# Patient Record
Sex: Female | Born: 1965 | Race: White | Hispanic: No | Marital: Married
Health system: Midwestern US, Community
[De-identification: ages and names within clinical notes are randomized; demographics above are authoritative.]

## PROBLEM LIST (undated history)

## (undated) DIAGNOSIS — K811 Chronic cholecystitis: Secondary | ICD-10-CM

---

## 2010-04-14 NOTE — Procedures (Signed)
[facility]   [address], [place], [place] [address]   SLEEP DISORDER CENTER   POLYSOMNOGRAM    PATIENT: Becky Nelson, Becky Nelson  MRN: [id number] DATE: 04/01/2010  BILLING: [id number] LOCATION:  INTERPRETING: Dorna Leitz, MD  REFERRING: Dorna Leitz, MD      STUDY NUMBER: 12-22.    PROCEDURE: PAP titration polysomnography (958.11).    DATA SUMMARY: The following is the diagnostic portion of the night: Total  sleep time 153.5 minutes, 63% sleep efficiency. The RDI was 54. The  percentage of sleep in REM was 8. Minimum oxygen saturation was 79%. The  arousal index was 68. Supine REM was 13 minutes. Awake after sleep onset  was 24 minutes. Periodic limb movements index was 24. The average heart  rate was 67 beats per minute. During the entire night, the patient had 22  minutes with a saturation below 88%.    TREATMENT PORTION: Sleep efficiency was 52% with 98 minutes slept. The RDI  in the treatment portion was 9. The REM percentage of sleep was 24. Minimum  oxygen saturation was 934. Snoring index was 45. Awake after sleep onset  was 79 minutes. Arousal index was 18 with PLM index of 24. Supine REM was  24 minutes with an average heart rate of 67 beats per minute.    MEDICATIONS  1. Amantadine.  2. Benicar.  3. Crestor.  4. Effexor.  5. Elavil.  6. Metformin.  7. Protonix.  8. Synthroid.    OBSERVATIONS: The blood pressure was 114/73 before the study and 120/78  afterwards. Tachypnea was noted frequently throughout the night. Mouth  breathing was occasionally noted, especially after CPAP titration.  Supplemental oxygen was not used. Sedation was not used. The patient had  insufficient sleep the previous night. Sleep hygiene violations included  caffeine. Sleep was interrupted by bathroom trip. No alpha intrusion or EEG  abnormalities occurred. There was an increased delta percentage. Delta was  43% of the total sleep time in the diagnostic portion and 29% in the   treatment portion. The cardiac tracing showed normal sinus rhythm; 18  awakenings occurred during the study.    The best mask was the [employer] 406 mask. The patient also tried  the small Quattro and the small Quattro FX. These were not used because of  air leaks. The pressures used during the study 5, 7, 9, 10, 12 and 14 CWP.  At a pressure of 12 CWP, the patient had supine REM. However, the RDI at  this pressure overall was 17.04, which is not adequate. She did well with  an index of zero at 14 CWP. However, this contained no supine REM.    IMPRESSION: Obstructive sleep apnea.    RECOMMENDATIONS: Will initiate CPAP using auto because of the significantly  higher need for pressure in supine REM but not so much in the other  portion, and the patient had difficulty staying asleep at the higher  pressure. Results and instructions regarding treatment will be reviewed  with the patient.                     Electronically Signed   Dorna Leitz, MD 04/15/2010 19:44   Dorna Leitz, MD    ADR:WMX  D: 04/14/2010 4:17 P T: 04/14/2010 11:49 P  Job #: [id number] CScriptDoc #: [id number]  cc: Dorna Leitz, MD  DR. _________ (UNIDENTIFIED/NOT DISTRIBUTED)

## 2010-04-14 NOTE — Procedures (Signed)
Procedures signed by  at 04/15/10  1944                 Author: Dorna Leitz, Becky Nelson  Service: --  Author Type: Physician            Filed:   Date of Service: 04/14/10 2349  Status: Signed            Procedure Orders        1. POLYSOMNOGRAPHY 1 NIGHT [[id number]] ordered by  at 04/14/10 2349                         <!--EPICS-->                      [facility]<BR>                 [address] [place], [place]  [address]<BR>                             SLEEP DISORDER CENTER<BR>                                POLYSOMNOGRAM<BR> <BR> PATIENT:      Becky Nelson, Becky Nelson<BR> MRN:              [id number]    DATE:       04/01/2010<BR> BILLING:          [id number]   LOCATION:<BR> INTERPRETING:  Becky Goldsmith Vasiliy Mccarry, Becky Nelson<BR> REFERRING:    Becky Goldsmith Mandolin Falwell, Becky Nelson<BR> <BR> <BR> STUDY NUMBER: 12-22.<BR> <BR> PROCEDURE: PAP titration polysomnography (958.11).<BR> <BR> DATA SUMMARY: The following is the diagnostic portion of the night: Total<BR> sleep time 153.5  minutes, 63% sleep efficiency. The RDI was 54. The<BR> percentage of sleep in REM was 8. Minimum oxygen saturation was 79%. The<BR> arousal index was 68. Supine REM was 13 minutes. Awake after sleep onset<BR> was 24 minutes. Periodic limb movements index  was 24. The average heart<BR> rate was 67 beats per minute. During the entire night, the patient had 22<BR> minutes with a saturation below 88%.<BR> <BR> TREATMENT PORTION: Sleep efficiency was 52% with 98 minutes slept. The RDI<BR> in the treatment portion  was 9. The REM percentage of sleep was 24. Minimum<BR> oxygen saturation was 934. Snoring index was 45. Awake after sleep onset<BR> was 79 minutes. Arousal index was 18 with PLM index of 24. Supine REM was<BR> 24 minutes with an average heart rate of  67 beats per minute.<BR> <BR> MEDICATIONS<BR> 1. Amantadine.<BR> 2. Benicar.<BR> 3. Crestor.<BR> 4. Effexor.<BR> 5. Elavil.<BR> 6. Metformin.<BR> 7. Protonix.<BR> 8. Synthroid.<BR> <BR> OBSERVATIONS: The  blood pressure was 114/73 before the study and  120/78<BR> afterwards. Tachypnea was noted frequently throughout the night. Mouth<BR> breathing was occasionally noted, especially after CPAP titration.<BR> Supplemental oxygen was not used. Sedation was not used. The patient had<BR> insufficient sleep  the previous night. Sleep hygiene violations included<BR> caffeine. Sleep was interrupted by bathroom trip. No alpha intrusion or EEG<BR> abnormalities occurred. There was an increased delta percentage. Delta was<BR> 43% of the total sleep time in the  diagnostic portion and 29% in the<BR> treatment portion. The cardiac tracing showed normal sinus rhythm; 18<BR> awakenings occurred during the study.<BR> <BR> The best mask was the [employer] 406 mask. The patient also tried<BR> the small Quattro  and the small Quattro  FX. These were not used because of<BR> air leaks. The pressures used during the study 5, 7, 9, 10, 12 and 14 CWP.<BR> At a pressure of 12 CWP, the patient had supine REM. However, the RDI at<BR> this pressure overall was 17.04, which  is not adequate. She did well with<BR> an index of zero at 14 CWP. However, this contained no supine REM.<BR> <BR> IMPRESSION: Obstructive sleep apnea.<BR> <BR> RECOMMENDATIONS: Will initiate CPAP using auto because of the significantly<BR> higher need  for pressure in supine REM but not so much in the other<BR> portion, and the patient had difficulty staying asleep at the higher<BR> pressure. Results and instructions regarding treatment will be reviewed<BR> with the patient.<BR> <BR> <BR> <BR> <BR>  <BR> <BR> <BR> <BR> <BR>  Electronically Signed<BR>  Dorna Leitz, Becky Nelson 04/15/2010 19:44<BR>                                 Becky Mcquinn D Nussen Pullin, Becky Nelson<BR> <BR> ADR:WMX<BR> D: 04/14/2010  4:17 P  T: 04/14/2010 11:49 P<BR> Job #:  [id number]  CScriptDoc #:  [id number]<BR>  cc:   Becky Tangeman D Shameria Trimarco, Becky Nelson<BR> DR. _________ (UNIDENTIFIED/NOT DISTRIBUTED)<BR> <!--EPICE-->

## 2011-09-05 LAB — AMB POC PVR, MEAS,POST-VOID RES,US,NON-IMAGING: PVR: 202 cc

## 2011-09-05 LAB — AMB POC URINALYSIS DIP STICK AUTO W/O MICRO
Bilirubin (UA POC): NEGATIVE
Blood (UA POC): NEGATIVE
Glucose (UA POC): NEGATIVE
Ketones (UA POC): NEGATIVE
Nitrites (UA POC): NEGATIVE
Protein (UA POC): NEGATIVE mg/dL
Specific gravity (UA POC): 1.02 (ref 1.001–1.035)
Urobilinogen (UA POC): 0.2 (ref 0.2–1)
pH (UA POC): 7 (ref 4.6–8.0)

## 2011-09-05 MED ORDER — SOLIFENACIN 10 MG TAB
10 mg | ORAL_TABLET | Freq: Every day | ORAL | Status: DC
Start: 2011-09-05 — End: 2011-09-05

## 2011-09-05 NOTE — Progress Notes (Signed)
HISTORY OF PRESENT ILLNESS  Becky Nelson is a 46 y.o. female.  HPI  46 y/o female with 3-6 yr hx of MS. Has developed "utis" manifest as bladder pressure, freq and urge. Not helped with ABX. C/o back pain. + smoker. + hematuria in past.  Had endometriosis and hysterectomy complicated by Rt hydro. C/o rt back pains.  Review of Systems   Constitutional: Positive for malaise/fatigue.   HENT: Negative.    Eyes: Negative.    Respiratory: Negative.    Gastrointestinal: Negative.    Genitourinary: Positive for urgency, frequency and flank pain.   Musculoskeletal: Positive for myalgias.   Skin: Negative.    Neurological: Positive for focal weakness.   Endo/Heme/Allergies: Positive for polydipsia.   Psychiatric/Behavioral: Negative.        Physical Exam   Constitutional: She is oriented to person, place, and time. She appears well-developed and well-nourished. No distress.   HENT:   Head: Normocephalic and atraumatic.   Eyes: Pupils are equal, round, and reactive to light.   Neck: Normal range of motion.   Cardiovascular: Normal rate.    Pulmonary/Chest: Effort normal.   Abdominal: Soft. Bowel sounds are normal. She exhibits no distension and no mass. There is no tenderness. There is no rebound and no guarding.   Musculoskeletal: Normal range of motion.   Neurological: She is alert and oriented to person, place, and time.   Skin: Skin is warm and dry. No rash noted. She is not diaphoretic. No erythema. No pallor.   Psychiatric: She has a normal mood and affect. Her behavior is normal. Judgment and thought content normal.     Results for orders placed in visit on 09/05/11   AMB POC URINALYSIS DIP STICK AUTO W/O MICRO       Component Value Range    Color Yellow  (none)    Clarity Clear  (none)    Glucose Negative  (none)    Bilirubin Negative  (none)    Ketones Negative  (none)    Spec.Grav. 1.020  1.001 - 1.035    Blood Negative  (none)    pH 7.0  4.6 - 8.0    Protein, Urine Negative  Negative mg/dL    Urobilinogen 0.2  mg/dL  0.2 - 1    Nitrites Negative  (none)    Leukocyte esterase 1+  (none)   AMB POC PVR, MEAS,POST-VOID RES,US,NON-IMAGING       Component Value Range    PVR 202         ASSESSMENT and PLAN  Encounter Diagnoses   Name Primary?   ??? Neurogenic bladder Yes   ??? Hematuria    ??? Smoker        CTU  Cysto  Recheck PVR  May need UDS

## 2011-09-06 NOTE — Telephone Encounter (Signed)
Patient husband called.  Patient is in pain and is unable to urinate. He states that he is unsure of when she voided last but it has been a long time. I advised they got to ER. He will take her to [facility].

## 2011-09-10 NOTE — Telephone Encounter (Signed)
[facility] called stating that they received a script from Dr. Sharyn Creamer on [place] for the patient.  She states that they got a fax backing stating patient does not take that medication.  Pharmacy states they have a script from Dr. Sharyn Creamer from 3 days ago.  She is asking that we either obtain auth for vesicare of send new script for [employer]- per insurance requirements.  Will send to [facility], as I don't see records indicating that we have prescribed Vesicare.  [facility] can be reached at [phone]

## 2011-09-11 NOTE — Telephone Encounter (Signed)
Spoke with patient who states that she doesn't take anything for her bladder.  I let the pharmacy know to disregard Rx.

## 2014-01-22 LAB — CBC WITH AUTOMATED DIFF
BASOPHILS: 0.8 % (ref 0–3)
EOSINOPHILS: 1.5 % (ref 0–5)
HCT: 40.8 % (ref 37.0–50.0)
HGB: 13.5 gm/dl (ref 13.0–17.2)
IMMATURE GRANULOCYTES: 0.2 % (ref 0.0–3.0)
LYMPHOCYTES: 33.4 % (ref 28–48)
MCH: 28.2 pg (ref 25.4–34.6)
MCHC: 33.1 gm/dl (ref 30.0–36.0)
MCV: 85.4 fL (ref 80.0–98.0)
MONOCYTES: 9.7 % (ref 1–13)
MPV: 11.2 fL — ABNORMAL HIGH (ref 6.0–10.0)
NEUTROPHILS: 54.4 % (ref 34–64)
NRBC: 0 (ref 0–0)
PLATELET: 227 10*3/uL (ref 140–450)
RBC: 4.78 M/uL (ref 3.60–5.20)
RDW-SD: 43.5 (ref 36.4–46.3)
WBC: 4.7 10*3/uL (ref 4.0–11.0)

## 2014-01-22 LAB — METABOLIC PANEL, BASIC
BUN: 16 mg/dl (ref 7–25)
CO2: 27 mEq/L (ref 21–32)
Calcium: 9.5 mg/dl (ref 8.5–10.1)
Chloride: 103 mEq/L (ref 98–107)
Creatinine: 0.9 mg/dl (ref 0.6–1.3)
GFR est AA: >60
GFR est non-AA: >60
Glucose: 114 mg/dl — ABNORMAL HIGH (ref 74–106)
Potassium: 4.3 mEq/L (ref 3.5–5.1)
Sodium: 140 mEq/L (ref 136–145)

## 2014-01-22 LAB — POC URINE MACROSCOPIC
Bilirubin: NEGATIVE
Blood: NEGATIVE
Glucose: NEGATIVE mg/dl
Ketone: NEGATIVE mg/dl
Leukocyte Esterase: NEGATIVE
Nitrites: NEGATIVE
Protein: NEGATIVE mg/dl
Specific gravity: >=1.03 (ref 1.005–1.030)
Urobilinogen: 0.2 EU/dl (ref 0.0–1.0)
pH (UA): 6 (ref 5–9)

## 2014-01-22 LAB — MAGNESIUM: Magnesium: 1.9 mg/dl (ref 1.8–2.4)

## 2014-01-22 NOTE — ED Provider Notes (Addendum)
[facility]  EMERGENCY DEPARTMENT TREATMENT REPORT  NAME:  Becky RoysHERRY, Becky Nelson  SEX:   F  ADMIT: 01/22/2014  DOB:   1965-06-18  MR#    [id number]  ROOM:  [id number]  TIME DICTATED: 07 11 AM  ACCT#  [id number]    cc: Sharyl Nimrodhristine Scherer PA-C    TIME OF EVALUATION:  5:04.    PRIMARY CARE PROVIDER:  Unknown.    CHIEF COMPLAINT:  Vertigo, MS.    HISTORY OF PRESENT ILLNESS:  This is a 48 year old female presenting to the Emergency Department with  numerous complaints.  She does have a history of MS.  She reports that she  chronically has tingling, numbness to the left side of her body, right-sided  heaviness.  She does intermittently have headaches with her MS, but for the  past week, they seem to be more constant.  Located on the front of her head,  described as sharp, associated with photophobia and dizziness.  She did have  one episode of vomiting yesterday, but no associated fever.  She is also  concerned, as she feels that she has increased confusion described as not  remembering certain phone calls.  Due to all the above complaints, here for  further evaluation.    REVIEW OF SYSTEMS:  CONSTITUTIONAL:  Denies fevers.  EYES:  Reports photophobia.  ENT:  No runny nose.  RESPIRATORY:  No shortness of breath.  CARDIOVASCULAR:  No chest pain.  GASTROINTESTINAL:  No abdominal pain.  Positive for vomiting and diarrhea.  GENITOURINARY:  Denies dysuria, but does report urinary urgency.  MUSCULOSKELETAL:  Reports chronic pain as above.  INTEGUMENTARY:  No rashes.  NEUROLOGIC:  Positive for headache.    PAST MEDICAL HISTORY:  Diabetes, partial thyroidectomy, hyperlipidemia, hypertension, MS,  appendectomy, hysterectomy, depression.    SOCIAL HISTORY:  Positive for tobacco use.    FAMILY HISTORY:  Denies history of MS in the family.    MEDICATIONS:  Multiple and reviewed.    ALLERGIES:  NO KNOWN DRUG ALLERGIES.    PHYSICAL EXAMINATION:  VITAL SIGNS:  Blood pressure 109/71, pulse 90, respirations 18, temperature   97.6, pain 0 out of 10, O2 sat 95% on room air.  GENERAL:  The patient well-nourished, well-developed, answering questions  appropriately, in no distress.  Eyes:  Conjunctivae clear, lids normal.  Pupils equal, symmetrical, and  normally reactive.   HEENT:  ENT:  Mouth:  Mucous membranes moist.  RESPIRATORY:  Clear and equal breath sounds.  No respiratory distress,  tachypnea, or accessory muscle use.     CARDIOVASCULAR:   Heart regular, without murmurs, gallops, rubs, or thrills.       GASTROINTESTINAL:  Normoactive bowel sounds.  Abdomen is soft and nontender.  MUSCULOSKELETAL:  Moving all extremities spontaneously while in the bed.  SKIN:  No visualized rashes.  NEUROLOGIC:  The patient reports decreased sensation in the left side of her  body, telling me this is chronic.  Appreciated with light touch, otherwise  motor strength equal and symmetric bilateral lower extremities.  Normal  finger-to-nose.    INITIAL ASSESSMENT AND MANAGEMENT PLAN:  This is a 48 year old female here secondary to, what she believes, is  worsening MS symptoms.  She does report dizziness, but no history of falls.  We will check labs to assure no acute etiology.  The patient reports that her  magnesium has been low and she has had similar symptoms in the past.    DIAGNOSTIC INTERPRETATIONS:  CBC unremarkable, normal white count,  normal hemoglobin and hematocrit.  Urine  negative for infection.  Magnesium within normal limits at 1.9.  BMP revealed  a glucose of 114, otherwise normal.     ER COURSE:  The patient stable while here in the Emergency Department.  Given a liter of  IV fluids for hydration.  Medicated for her dizziness with oral Antivert.  She  was reassured of laboratory findings.  I do recommend that she follow up with  her doctor for further evaluation and management.    CLINICAL IMPRESSION AND DIAGNOSIS:  Multiple sclerosis exacerbation.    DISPOSITION AND PLAN:   The patient discharged home.  Prescriptions and instructions as above.      The patient was personally evaluated by myself and Dr. Wenda OverlandHimmel-Quinnlyn Hearns who agrees  with the above assessment and plan.       CONTINUATION BY Rashad Auld HIMMEL-Milena Liggett, DO:    I interviewed and examined the patient.  I discussed with the mid-level  provider and agree with their evaluation and plan as documented here.     The patient is a 48 year old lady here with what appears to be an MS  exacerbation.  The patient does not have anything at home for vertigo, which  is part of her presentation here today.  The patient has had Antivert in the  past and stated that it was helpful for her.  While we cannot help her with  her MS, we can certainly write her for a prescription for the Antivert to go  home with and she was grateful for this.  She will be following up with  neurology.      ___________________  Liberty HandyJennifer H Himmel-Mylene Bow DO  Dictated By: Kristeen MansBrittany Irwin, PA    My signature above authenticates this document and my orders, the final  diagnosis (es), discharge prescription (s), and instructions in the [facility]  Pulsecheck record.  Nursing notes have been reviewed by the physician/mid-level provider.    If you have any questions please contact [phone].    SU  D:01/22/2014 07:11:35  T: 01/22/2014 20:16:17  [id number]  Electronically Authenticated by:  Carolin GuernseyJENNIFER HIMMEL-Mayerly Kaman, DO On 01/29/2014 08:03 PM EST

## 2014-06-15 ENCOUNTER — Inpatient Hospital Stay
Admit: 2014-06-15 | Discharge: 2014-06-16 | Disposition: A | Discharge status: Home / Self Care | Payer: MEDICARE | Attending: Emergency Medical Services

## 2014-06-15 DIAGNOSIS — R443 Hallucinations, unspecified: Secondary | ICD-10-CM

## 2014-06-15 NOTE — ED Notes (Signed)
Fell yesterday and hit head. unkown LOC. [facility] and had a CT scan yesterday and was negative. History of MS pt. States her symptoms are worse.

## 2014-06-16 NOTE — Other (Signed)
12:06 AM  06/16/2014     Discharge instructions given to Becky Nelson (name) with verbalization of understanding. Patient accompanied by spouse.  Patient discharged with the following prescriptions none. Patient discharged to home (destination).      APRIL Catha GosselinP DOVE, RN

## 2014-06-16 NOTE — ED Provider Notes (Signed)
[facility]  EMERGENCY [facility] TREATMENT REPORT  NAME:  Becky Nelson, Becky Nelson  SEX:   F  ADMIT: 06/15/2014  DOB:   [birth year]  MR#    [id number]  ROOM:  [id number]  TIME DICTATED: 01 05 AM  ACCT#  [id number]        CHIEF COMPLAINT:  Increased hallucinations.    HISTORY OF PRESENT ILLNESS:  This is a 49 year old female that arrived to the Emergency Department.  She   has a history of MS, history of hallucinations secondary to the MS.  She   apparently has dizziness.  She states that is chronic.  She states that   occasionally she will have dizziness, fall.  They have given her meclizine for   this.  They have worked her up for this as well.  Yesterday afternoon, had a   dizzy spell, fell, collapsed, did not know what happened.  Husband took her to   the hospital and they did a CT scan and lab work, urine, magnesium level.     Everything was normal and they sent her home.  Today, continued having   hallucinations, conversing with people that were not there, so husband decided   to go ahead and bring her in for evaluation.  She has had no vomiting.  The   weakness that she has to the lower extremities is typical.  She is able to   ambulate.  She states her back pain is typical lower back pain that she has.    No other complaints.  No fever, no chills, no nausea, no vomiting.  Here for a   second opinion.    REVIEW OF SYSTEMS:  CONSTITUTIONAL:  No fever, chills, or weight loss.  EYES:   No visual symptoms.  ENT:  No sore throat, runny nose, or other URI symptoms.  ENDOCRINE:  No diabetic symptoms.  HEMATOLOGIC/LYMPHATIC:   No excessive bruising or lymph node swelling.  ALLERGIC/IMMUNOLOGIC:  No urticaria or allergy symptoms.  RESPIRATORY:  No cough, shortness of breath, or wheezing.  CARDIOVASCULAR:  No chest pain, chest pressure, or palpitations.  GASTROINTESTINAL:  No vomiting, diarrhea, or abdominal pain.  GENITOURINARY:  No dysuria, frequency, or urgency.  MUSCULOSKELETAL:  No joint pain or swelling.   INTEGUMENTARY:  No rashes.  NEUROLOGICAL:  No headaches, sensory or motor symptoms.  PSYCHIATRIC:  She states she is depressed, but not suicidal or homicidal.    Hallucinating, but those are chronic hallucinations for her.    FAMILY HISTORY:    Hypertension, diabetes.    SOCIAL HISTORY:  Does not drink or smoke.    ALLERGIES:  SEE IBEX.    MEDICATIONS:  Multiple and reviewed in Ibex.    PHYSICAL EXAMINATION:  VITAL SIGNS:  129/91, 75, 18, 97.2, 0 to 10 pain scale 4 out of 10, but that   is chronic back pain.  O2 saturation 100%.  GENERAL APPEARANCE:  Patient appears well developed and well nourished.    Appearance and behavior are age and situation appropriate.  HEENT:  Head is normocephalic, atraumatic.  Eyes:  Conjunctivae clear, lids   normal.  Pupils equal, symmetrical, and normally reactive.  NECK:  Supple, nontender, symmetrical, no masses or JVD, trachea midline,   thyroid not enlarged, nodular, or tender.  RESPIRATORY:  Clear and equal breath sounds.  No respiratory distress,   tachypnea, or accessory muscle use.    CARDIAC:  Heart regular rate and rhythm without any rubs, murmurs, gallops or   thrills.  GI:  Abdomen soft, nontender, without complaint of pain to palpation.  No   hepatomegaly or splenomegaly.  SKIN:  Warm and dry without rashes.  NEUROLOGIC:  Alert, oriented.  Sensation intact, motor strength equal and   symmetric.  She is at baseline to her normal self.  She is alert and oriented   times 3.  Pupils are equal, concentric to light and accommodation.    Extraocular movements intact.  She is able to move her upper and lower   extremities without any difficulty.    MUSCULOSKELETAL:  Does have some weakness of the lower extremities, but she   states that is secondary to her MS.  She has point tenderness to her lumbar   spine and right sacroiliac joint, but she says that is normal for her.  She   has bulging disk per MRI in 2011 when she found out about her MS.  I am able    to internally and externally rotate and flex and extend both hips, knees.  I   am unable to flex and extend the knees and ankles without any tenderness.    VASCULAR:  Femoral, popliteal, dorsalis pedis and posterior tibia pulses are   +2.  Capillary refill less than 3 seconds.    COURSE IN THE EMERGENCY DEPARTMENT:  At this present time, we will go ahead and evaluate, review patient's CT scan   and lab work and discuss with her neurologist these Findings.  The only change   that she has had is that they took her off the [employer] 2 weeks ago in order   to start her on a new MS regimen.  There are no focal findings or head   injury.  There are no new changes neurologically.  She states that she is here   because she is hallucinating more, but she has been hallucinating for several   years, and the neurologist knows about this, and he attributed this to her   MS.     This is a new problem for this patient.  Old records were reviewed.  No   additional relevant information was obtained.  The patient's history was   discussed with family members.  No additional relevant information was   obtained.  Nursing notes were reviewed.    CONTINUATION BY Hilaria Ota, PA-C    EMERGENCY DEPARTMENT COURSE:  I went ahead and spoke Dr. Connye Burkitt at [facility], where he stated that   CT scan of the head was negative.  The patient had CBC, Chem-7, magnesium,   cath UA and they were all normal.  He sent her home.  Stated that she needed   to follow up with Dr. Dimple Casey about the increased hallucinations.  She decided to   come in here because she thought that the hallucinations were worse, but she   had no neurological symptoms of intracranial bleed.  Dr. Jannetta Quint   examined the patient, agreed and also spoke to Dr. Gregary Cromer partner, and he   stated that unless she is having neurological symptoms of increased   intracranial bleeding, right now a CAT scan is not warranted.  He is going to    see if they can get her an earlier appointment instead of 4 weeks.  The   patient agreed with the above.    FINAL DIAGNOSES:  1.  Closed head injury evaluation, second visit.  2.  Hallucinations, chronic.    The patient was personally evaluated by myself and Dr. Jannetta Quint,  who   agrees with the above assessment and plan.    CONTINUATION BY Jannetta Quint, MD    I interviewed and examined the patient.  I discussed with the advanced   practice clinician and agree with their evaluation and plan as documented   here.  I have seen and evaluated this 49 year old female, with a chronic   history of hallucination, with a closed head injury.  At this time, we are   recommending the patient regarding her hallucinations with her neurologist,   Dr. Dimple Casey.  At this point, the patient has already had neurologic-imaging,   which is negative.  I am comfortable with this patient having outpatient   followup testing.      ___________________  Lucio Edward MD  Dictated By: Hilaria Ota, PA-C    My signature above authenticates this document and my orders, the final  diagnosis (es), discharge prescription (s), and instructions in the [facility]   Pulsecheck record.  If you have any questions please contact [phone].    Nursing notes have been reviewed by the physician/ advanced practice   clinician.    JB  D:06/16/2014 01:05:48  T: 06/16/2014 07:51:30  [id number]

## 2016-09-06 ENCOUNTER — Ambulatory Visit: Payer: MEDICARE | Primary: Family Medicine

## 2016-09-06 ENCOUNTER — Ambulatory Visit: Admit: 2016-09-06 | Payer: MEDICARE | Primary: Family Medicine

## 2016-09-06 ENCOUNTER — Inpatient Hospital Stay: Payer: MEDICARE

## 2016-09-06 LAB — GLUCOSE, POC: Glucose (POC): 175 mg/dL — ABNORMAL HIGH (ref 65–105)

## 2016-09-06 MED ORDER — LABETALOL 5 MG/ML IV SOLN
5 mg/mL | INTRAVENOUS | Status: DC | PRN
Start: 2016-09-06 — End: 2016-09-06

## 2016-09-06 MED ORDER — BUPIVACAINE (PF) 0.25 % (2.5 MG/ML) IJ SOLN
0.25 % (2.5 mg/mL) | INTRAMUSCULAR | Status: DC | PRN
Start: 2016-09-06 — End: 2016-09-06
  Administered 2016-09-06: 16:00:00 via SUBCUTANEOUS

## 2016-09-06 MED ORDER — PHENYLEPHRINE 10 MG/ML INJECTION
10 mg/mL | INTRAMUSCULAR | Status: AC
Start: 2016-09-06 — End: ?

## 2016-09-06 MED ORDER — PROPOFOL 10 MG/ML IV EMUL
10 mg/mL | INTRAVENOUS | Status: AC
Start: 2016-09-06 — End: ?

## 2016-09-06 MED ORDER — HYDRALAZINE 20 MG/ML IJ SOLN
20 mg/mL | INTRAMUSCULAR | Status: DC | PRN
Start: 2016-09-06 — End: 2016-09-06

## 2016-09-06 MED ORDER — FENTANYL CITRATE (PF) 50 MCG/ML IJ SOLN
50 mcg/mL | INTRAMUSCULAR | Status: DC | PRN
Start: 2016-09-06 — End: 2016-09-06
  Administered 2016-09-06 (×2): via INTRAVENOUS

## 2016-09-06 MED ORDER — NALOXONE 0.4 MG/ML INJECTION
0.4 mg/mL | INTRAMUSCULAR | Status: DC | PRN
Start: 2016-09-06 — End: 2016-09-06

## 2016-09-06 MED ORDER — SUGAMMADEX 100 MG/ML INTRAVENOUS SOLUTION
100 mg/mL | INTRAVENOUS | Status: AC
Start: 2016-09-06 — End: ?

## 2016-09-06 MED ORDER — MIDAZOLAM 1 MG/ML IJ SOLN
1 mg/mL | INTRAMUSCULAR | Status: AC
Start: 2016-09-06 — End: ?

## 2016-09-06 MED ORDER — ROCURONIUM 10 MG/ML IV
10 mg/mL | INTRAVENOUS | Status: AC
Start: 2016-09-06 — End: ?

## 2016-09-06 MED ORDER — MIDAZOLAM 1 MG/ML IJ SOLN
1 mg/mL | INTRAMUSCULAR | Status: DC | PRN
Start: 2016-09-06 — End: 2016-09-06
  Administered 2016-09-06: 15:00:00 via INTRAVENOUS

## 2016-09-06 MED ORDER — ONDANSETRON (PF) 4 MG/2 ML INJECTION
4 mg/2 mL | INTRAMUSCULAR | Status: AC
Start: 2016-09-06 — End: ?

## 2016-09-06 MED ORDER — PROMETHAZINE IN NS 6.25 MG/50 ML IV PIGGY BAG
6.25 mg/50 ml | INTRAVENOUS | Status: DC | PRN
Start: 2016-09-06 — End: 2016-09-06

## 2016-09-06 MED ORDER — FENTANYL CITRATE (PF) 50 MCG/ML IJ SOLN
50 mcg/mL | INTRAMUSCULAR | Status: DC | PRN
Start: 2016-09-06 — End: 2016-09-06
  Administered 2016-09-06 (×4): via INTRAVENOUS

## 2016-09-06 MED ORDER — PROPOFOL 10 MG/ML IV EMUL
10 mg/mL | INTRAVENOUS | Status: DC | PRN
Start: 2016-09-06 — End: 2016-09-06
  Administered 2016-09-06: 15:00:00 via INTRAVENOUS

## 2016-09-06 MED ORDER — OCTYL 2-CYANOACRYLATE TOPICAL LIQUID
CUTANEOUS | Status: AC
Start: 2016-09-06 — End: ?

## 2016-09-06 MED ORDER — HYDROCODONE-ACETAMINOPHEN 5 MG-300 MG TAB
5-300 mg | ORAL_TABLET | ORAL | 0 refills | Status: AC | PRN
Start: 2016-09-06 — End: ?

## 2016-09-06 MED ORDER — FENTANYL CITRATE (PF) 50 MCG/ML IJ SOLN
50 mcg/mL | INTRAMUSCULAR | Status: AC
Start: 2016-09-06 — End: ?

## 2016-09-06 MED ORDER — LACTATED RINGERS IV
INTRAVENOUS | Status: DC | PRN
Start: 2016-09-06 — End: 2016-09-06
  Administered 2016-09-06: 14:00:00 via INTRAVENOUS

## 2016-09-06 MED ORDER — DIPHENHYDRAMINE HCL 50 MG/ML IJ SOLN
50 mg/mL | INTRAMUSCULAR | Status: DC | PRN
Start: 2016-09-06 — End: 2016-09-06

## 2016-09-06 MED ORDER — PROMETHAZINE 25 MG/ML INJECTION
25 mg/mL | INTRAMUSCULAR | Status: DC | PRN
Start: 2016-09-06 — End: 2016-09-06

## 2016-09-06 MED ORDER — FLUMAZENIL 0.1 MG/ML IV SOLN
0.1 mg/mL | INTRAVENOUS | Status: DC | PRN
Start: 2016-09-06 — End: 2016-09-06

## 2016-09-06 MED ORDER — BALANCED SALT IRRIG SOLN COMB2 INTRAOCULAR
INTRAOCULAR | Status: AC
Start: 2016-09-06 — End: 2016-09-06
  Administered 2016-09-06: 14:00:00

## 2016-09-06 MED ORDER — CEFOXITIN 2 GRAM IV SOLUTION
2 gram | INTRAVENOUS | Status: AC
Start: 2016-09-06 — End: 2016-09-06
  Administered 2016-09-06: 15:00:00 via INTRAVENOUS

## 2016-09-06 MED ORDER — HYDROCODONE-ACETAMINOPHEN 5 MG-325 MG TAB
5-325 mg | ORAL | Status: AC
Start: 2016-09-06 — End: 2016-09-06
  Administered 2016-09-06: 18:00:00 via ORAL

## 2016-09-06 MED ORDER — ONDANSETRON (PF) 4 MG/2 ML INJECTION
4 mg/2 mL | INTRAMUSCULAR | Status: DC | PRN
Start: 2016-09-06 — End: 2016-09-06
  Administered 2016-09-06: 15:00:00 via INTRAVENOUS

## 2016-09-06 MED ORDER — BUPIVACAINE (PF) 0.25 % (2.5 MG/ML) IJ SOLN
0.25 % (2.5 mg/mL) | INTRAMUSCULAR | Status: AC
Start: 2016-09-06 — End: ?

## 2016-09-06 MED ORDER — LIDOCAINE (PF) 10 MG/ML (1 %) IJ SOLN
10 mg/mL (1 %) | INTRAMUSCULAR | Status: DC | PRN
Start: 2016-09-06 — End: 2016-09-06
  Administered 2016-09-06: 15:00:00 via INTRAVENOUS

## 2016-09-06 MED ORDER — ROCURONIUM 10 MG/ML IV
10 mg/mL | INTRAVENOUS | Status: DC | PRN
Start: 2016-09-06 — End: 2016-09-06
  Administered 2016-09-06 (×2): via INTRAVENOUS

## 2016-09-06 MED ORDER — IPRATROPIUM-ALBUTEROL 2.5 MG-0.5 MG/3 ML NEB SOLUTION
2.5 mg-0.5 mg/3 ml | RESPIRATORY_TRACT | Status: DC | PRN
Start: 2016-09-06 — End: 2016-09-06

## 2016-09-06 MED ORDER — IOTHALAMATE MEGLUMINE 60 % INJECTION
60 % | INTRAMUSCULAR | Status: DC | PRN
Start: 2016-09-06 — End: 2016-09-06
  Administered 2016-09-06: 16:00:00

## 2016-09-06 MED ORDER — SODIUM CHLORIDE 0.9 % INJECTION
10 mg/mL | INTRAMUSCULAR | Status: DC | PRN
Start: 2016-09-06 — End: 2016-09-06
  Administered 2016-09-06 (×5): via INTRAVENOUS

## 2016-09-06 MED ORDER — DEXAMETHASONE SODIUM PHOSPHATE 4 MG/ML IJ SOLN
4 mg/mL | INTRAMUSCULAR | Status: AC
Start: 2016-09-06 — End: ?

## 2016-09-06 MED ORDER — SUGAMMADEX 100 MG/ML INTRAVENOUS SOLUTION
100 mg/mL | INTRAVENOUS | Status: DC | PRN
Start: 2016-09-06 — End: 2016-09-06
  Administered 2016-09-06 (×4): via INTRAVENOUS

## 2016-09-06 MED ORDER — GLYCOPYRROLATE 0.2 MG/ML IJ SOLN
0.2 mg/mL | INTRAMUSCULAR | Status: AC
Start: 2016-09-06 — End: ?

## 2016-09-06 MED ORDER — GLYCOPYRROLATE 0.2 MG/ML IJ SOLN
0.2 mg/mL | INTRAMUSCULAR | Status: DC | PRN
Start: 2016-09-06 — End: 2016-09-06
  Administered 2016-09-06: 15:00:00 via INTRAVENOUS

## 2016-09-06 MED ORDER — DEXAMETHASONE SODIUM PHOSPHATE 4 MG/ML IJ SOLN
4 mg/mL | INTRAMUSCULAR | Status: DC | PRN
Start: 2016-09-06 — End: 2016-09-06
  Administered 2016-09-06: 15:00:00 via INTRAVENOUS

## 2016-09-06 MED ORDER — BACTERIOSTATIC SALINE 0.9 % IJ SOLN
0.9 % | INTRAMUSCULAR | Status: AC
Start: 2016-09-06 — End: ?

## 2016-09-06 MED ORDER — EPHEDRINE SULFATE 50 MG/ML INJECTION SOLUTION
50 mg/mL | INTRAMUSCULAR | Status: AC
Start: 2016-09-06 — End: ?

## 2016-09-06 MED ORDER — LIDOCAINE HCL 1 % (10 MG/ML) IJ SOLN
10 mg/mL (1 %) | INTRAMUSCULAR | Status: AC
Start: 2016-09-06 — End: ?

## 2016-09-06 MED FILL — GLYCOPYRROLATE 0.2 MG/ML IJ SOLN: 0.2 mg/mL | INTRAMUSCULAR | Qty: 1

## 2016-09-06 MED FILL — FENTANYL CITRATE (PF) 50 MCG/ML IJ SOLN: 50 mcg/mL | INTRAMUSCULAR | Qty: 2

## 2016-09-06 MED FILL — EPHEDRINE SULFATE 50 MG/ML INTRAVENOUS SOLUTION: 50 mg/mL | INTRAVENOUS | Qty: 1

## 2016-09-06 MED FILL — BRIDION 100 MG/ML INTRAVENOUS SOLUTION: 100 mg/mL | INTRAVENOUS | Qty: 2

## 2016-09-06 MED FILL — OCTYLSEAL TOPICAL LIQUID: CUTANEOUS | Qty: 6

## 2016-09-06 MED FILL — ONDANSETRON (PF) 4 MG/2 ML INJECTION: 4 mg/2 mL | INTRAMUSCULAR | Qty: 2

## 2016-09-06 MED FILL — MIDAZOLAM 1 MG/ML IJ SOLN: 1 mg/mL | INTRAMUSCULAR | Qty: 2

## 2016-09-06 MED FILL — DEXAMETHASONE SODIUM PHOSPHATE 4 MG/ML IJ SOLN: 4 mg/mL | INTRAMUSCULAR | Qty: 5

## 2016-09-06 MED FILL — CEFOXITIN 2 GRAM IV SOLUTION: 2 gram | INTRAVENOUS | Qty: 2

## 2016-09-06 MED FILL — DIPRIVAN 10 MG/ML INTRAVENOUS EMULSION: 10 mg/mL | INTRAVENOUS | Qty: 20

## 2016-09-06 MED FILL — BUPIVACAINE (PF) 0.25 % (2.5 MG/ML) IJ SOLN: 0.25 % (2.5 mg/mL) | INTRAMUSCULAR | Qty: 30

## 2016-09-06 MED FILL — SODIUM CHLORIDE 0.9 % IV PIGGY BACK: INTRAVENOUS | Qty: 50

## 2016-09-06 MED FILL — HYDROCODONE-ACETAMINOPHEN 5 MG-325 MG TAB: 5-325 mg | ORAL | Qty: 1

## 2016-09-06 MED FILL — BSS INTRAOCULAR SOLUTION: INTRAOCULAR | Qty: 30

## 2016-09-06 MED FILL — BACTERIOSTATIC SALINE 0.9 % IJ SOLN: 0.9 % | INTRAMUSCULAR | Qty: 30

## 2016-09-06 MED FILL — LIDOCAINE HCL 1 % (10 MG/ML) IJ SOLN: 10 mg/mL (1 %) | INTRAMUSCULAR | Qty: 20

## 2016-09-06 MED FILL — PHENYLEPHRINE 10 MG/ML INJECTION: 10 mg/mL | INTRAMUSCULAR | Qty: 1

## 2016-09-06 MED FILL — ROCURONIUM 10 MG/ML IV: 10 mg/mL | INTRAVENOUS | Qty: 5

## 2016-09-06 NOTE — Anesthesia Post-Procedure Evaluation (Signed)
Post-Anesthesia Evaluation and Assessment    Patient: Becky EarthlyYvonne C Saiz MRN: [id number]  SSN: [id number]    Date of Birth: 12-Aug-1965  Age: 51 y.o.  Sex: female       Cardiovascular Function/Vital Signs  Visit Vitals   ??? BP 151/88   ??? Pulse 87   ??? Temp 37.2 ??C (99 ??F)   ??? Resp 16   ??? Ht 5' (1.524 m)   ??? Wt 90.7 kg (199 lb 15.3 oz)   ??? SpO2 93%   ??? BMI 39.05 kg/m2       Patient is status post general anesthesia for Procedure(s):  LAPAROSCOPIC CHOLECYSTECTOMY; INTRAOPERATIVE CHOLANGIOGRAM; LIVER BIOPSY.    Nausea/Vomiting: None    Postoperative hydration reviewed and adequate.    Pain:  Pain Scale 1: Numeric (0 - 10) (09/06/16 1358)  Pain Intensity 1: 3 (09/06/16 1358)   Managed    Neurological Status:   Neuro (WDL): Within Defined Limits (09/06/16 0943)   At baseline    Mental Status and Level of Consciousness: Arousable    Pulmonary Status:   O2 Device: Room air (09/06/16 1330)   Adequate oxygenation and airway patent    Complications related to anesthesia: None    Post-anesthesia assessment completed. No concerns    Signed By: Vickey HugerJames Cletus Mehlhoff, MD     September 06, 2016

## 2016-09-06 NOTE — Anesthesia Pre-Procedure Evaluation (Addendum)
Anesthetic History     PONV          Review of Systems / Medical History  Patient summary reviewed, nursing notes reviewed and pertinent labs reviewed    Pulmonary        Sleep apnea: No treatment           Neuro/Psych   Within defined limits          Comments: MS x 10 years; fatigue;  Cardiovascular    Hypertension: well controlled              Exercise tolerance: >4 METS     GI/Hepatic/Renal     GERD: well controlled      Liver disease    Comments: Fatty Liver Endo/Other    Diabetes: poorly controlled, type 2  Hypothyroidism: well controlled       Other Findings            Physical Exam    Airway  Mallampati: III  TM Distance: 4 - 6 cm  Neck ROM: normal range of motion   Mouth opening: Normal     Cardiovascular  Regular rate and rhythm,  S1 and S2 normal,  no murmur, click, rub, or gallop  Rhythm: regular  Rate: normal         Dental    Dentition: Full upper dentures     Pulmonary  Breath sounds clear to auscultation               Abdominal  GI exam deferred       Other Findings            Anesthetic Plan    ASA: 3  Anesthesia type: general          Induction: Intravenous  Anesthetic plan and risks discussed with: Patient

## 2016-09-06 NOTE — Other (Signed)
Dr. Jackson LatinoPecsok made aware of low saturation 89 he is ok with sending her to phase 2.  Pt will bring sats up when she is not talking.

## 2016-09-06 NOTE — Op Note (Signed)
[facility]  Operation Report  NAME:  Nelson, Becky  SEX:   F  DATE: 09/06/2016  DOB: 09/26/1965  MR#    [id number]  ROOM:  PL  ACCT#  [id number]        PREOPERATIVE DIAGNOSIS:  Gallstone with elevated liver function tests.    POSTOPERATIVE DIAGNOSIS:  Gallstone with elevated liver function tests, with some cirrhosis.    PROCEDURE:    Laparoscopic cholecystectomy with intraoperative cholangiogram, Tru-Cut liver biopsy.    SURGEON:  Isaiah Torok, MD.    ANESTHESIA:  General anesthesia.     ESTIMATED BLOOD LOSS:  20 mL.    INDICATION FOR PROCEDURE:  A 50-year-old female with abdominal pain and ultrasound showing one questionable stone versus a polyp with sludge, as well as elevated LFTs.  The patient now for laparoscopic cholecystectomy.    DESCRIPTION OF PROCEDURE:  The patient was taken to the operating room and identified as the correct patient.  The patient was placed in the supine position.  The patient was placed under general endotracheal intubation as per anesthesia.  The patient was then prepped and draped in normal standard fashion.  A supraumbilical incision was then made.  A Veress needle was then inserted.  CO2 insufflation was then performed, the Veress needle removed.  A trocar was then placed.  Under direct vision, 3 other trocars were placed, 1 in the epigastric and 2 in the right upper quadrant.  After this was done, the gallbladder was then grasped and sent cephalad.  Careful dissection at the neck of the gallbladder was then performed.  Careful dissection was performed around this, which revealed the cystic duct and cystic artery.  The liver was cirrhotic and was swollen, and could not be elevated and retracted superiorly that far.  Because of this, dissection at the neck of the gallbladder was then carefully done.  The cystic duct was then carefully dissected around.  A catheter was then used and placed, which revealed free flowing duodenum and no stones in the common bile duct.   Catheter was then removed, and 2 clips placed on the proximal side and the cystic duct was cut.  After this was done, the cystic artery was carefully dissected around, 2 clips placed proximally and 1 clip placed distally, and the cystic artery was then cut.  The gallbladder was then carefully dissected away from the liver bed, and placed in EndoCatch bag and removed through the supraumbilical trocar site.  After this was done, a Tru-Cut needle was then used to go through the abdominal wall.  A Tru-Cut needle biopsy was taken of the liver, and this was sent to pathology.  Electrocautery was used for hemostasis of the liver.  The liver was nodular and swollen, and somewhat fatty infiltration.  After this was done, further irrigations and aspirations were then performed.  Once hemostasis was obtained, the trocars were then all removed.  The supraumbilical trocar site was closed with 0 Vicryl suture, then 4-0 Monocryl suture to close all 4 ports.  The patient tolerated procedure well.      ___________________  Deaundra Kutzer L Geoffrey Hynes MD  Dictated By:.   NS  D:09/06/2016 14:43:07  T: 09/06/2016 15:50:56  [id number]

## 2016-09-06 NOTE — Op Note (Signed)
[facility]  [facility]  NAME:  Becky RoysHERRY, Becky  SEX:   F  DATE: 09/06/2016  DOB: Nov 19, 1965  MR#    [id number]  ROOM:  PL  ACCT#  [id number]        PREOPERATIVE DIAGNOSIS:  Gallstone with elevated liver function tests.    POSTOPERATIVE DIAGNOSIS:  Gallstone with elevated liver function tests, with some cirrhosis.    PROCEDURE:    Laparoscopic cholecystectomy with intraoperative cholangiogram, Tru-Cut liver biopsy.    SURGEON:  Waynetta Sandyavid Dwan Hemmelgarn, MD.    ANESTHESIA:  General anesthesia.     ESTIMATED BLOOD LOSS:  20 mL.    INDICATION FOR PROCEDURE:  A 51 year old female with abdominal pain and ultrasound showing one questionable stone versus a polyp with sludge, as well as elevated LFTs.  The patient now for laparoscopic cholecystectomy.    DESCRIPTION OF PROCEDURE:  The patient was taken to the operating room and identified as the correct patient.  The patient was placed in the supine position.  The patient was placed under general endotracheal intubation as per anesthesia.  The patient was then prepped and draped in normal standard fashion.  A supraumbilical incision was then made.  A Veress needle was then inserted.  CO2 insufflation was then performed, the Veress needle removed.  A trocar was then placed.  Under direct vision, 3 other trocars were placed, 1 in the epigastric and 2 in the right upper quadrant.  After this was done, the gallbladder was then grasped and sent cephalad.  Careful dissection at the neck of the gallbladder was then performed.  Careful dissection was performed around this, which revealed the cystic duct and cystic artery.  The liver was cirrhotic and was swollen, and could not be elevated and retracted superiorly that far.  Because of this, dissection at the neck of the gallbladder was then carefully done.  The cystic duct was then carefully dissected around.  A catheter was then used and placed, which revealed free flowing duodenum and no stones in the common bile duct.   Catheter was then removed, and 2 clips placed on the proximal side and the cystic duct was cut.  After this was done, the cystic artery was carefully dissected around, 2 clips placed proximally and 1 clip placed distally, and the cystic artery was then cut.  The gallbladder was then carefully dissected away from the liver bed, and placed in EndoCatch bag and removed through the supraumbilical trocar site.  After this was done, a Tru-Cut needle was then used to go through the abdominal wall.  A Tru-Cut needle biopsy was taken of the liver, and this was sent to pathology.  Electrocautery was used for hemostasis of the liver.  The liver was nodular and swollen, and somewhat fatty infiltration.  After this was done, further irrigations and aspirations were then performed.  Once hemostasis was obtained, the trocars were then all removed.  The supraumbilical trocar site was closed with 0 Vicryl suture, then 4-0 Monocryl suture to close all 4 ports.  The patient tolerated procedure well.      ___________________  Philis PiqueAVID L Mackenzie Lia MD  Dictated By:.   NS  D:09/06/2016 14:43:07  T: 09/06/2016 15:50:56  [id number]

## 2016-09-11 MED FILL — PHENYLEPHRINE 10 MG/ML INJECTION: 10 mg/mL | INTRAMUSCULAR | Qty: 300

## 2016-09-11 MED FILL — DEXAMETHASONE SODIUM PHOSPHATE 4 MG/ML IJ SOLN: 4 mg/mL | INTRAMUSCULAR | Qty: 8

## 2016-09-11 MED FILL — BRIDION 100 MG/ML INTRAVENOUS SOLUTION: 100 mg/mL | INTRAVENOUS | Qty: 200

## 2016-09-11 MED FILL — ROCURONIUM 10 MG/ML IV: 10 mg/mL | INTRAVENOUS | Qty: 45

## 2016-09-11 MED FILL — ONDANSETRON (PF) 4 MG/2 ML INJECTION: 4 mg/2 mL | INTRAMUSCULAR | Qty: 4

## 2016-09-11 MED FILL — XYLOCAINE-MPF 10 MG/ML (1 %) INJECTION SOLUTION: 10 mg/mL (1 %) | INTRAMUSCULAR | Qty: 50

## 2016-09-11 MED FILL — LACTATED RINGERS IV: INTRAVENOUS | Qty: 900

## 2016-09-11 MED FILL — DIPRIVAN 10 MG/ML INTRAVENOUS EMULSION: 10 mg/mL | INTRAVENOUS | Qty: 120

## 2016-09-11 MED FILL — GLYCOPYRROLATE 0.2 MG/ML IJ SOLN: 0.2 mg/mL | INTRAMUSCULAR | Qty: 0.2

## 2019-01-01 IMAGING — DX XR CHEST 1 VIEW
1 series · 1 of 1 positions shown · non-contrast
Comparison: none

SINGLE PORTABLE CHEST:
CLINICAL INDICATION: chest pain/SOB
REFERENCE: None

[AP]
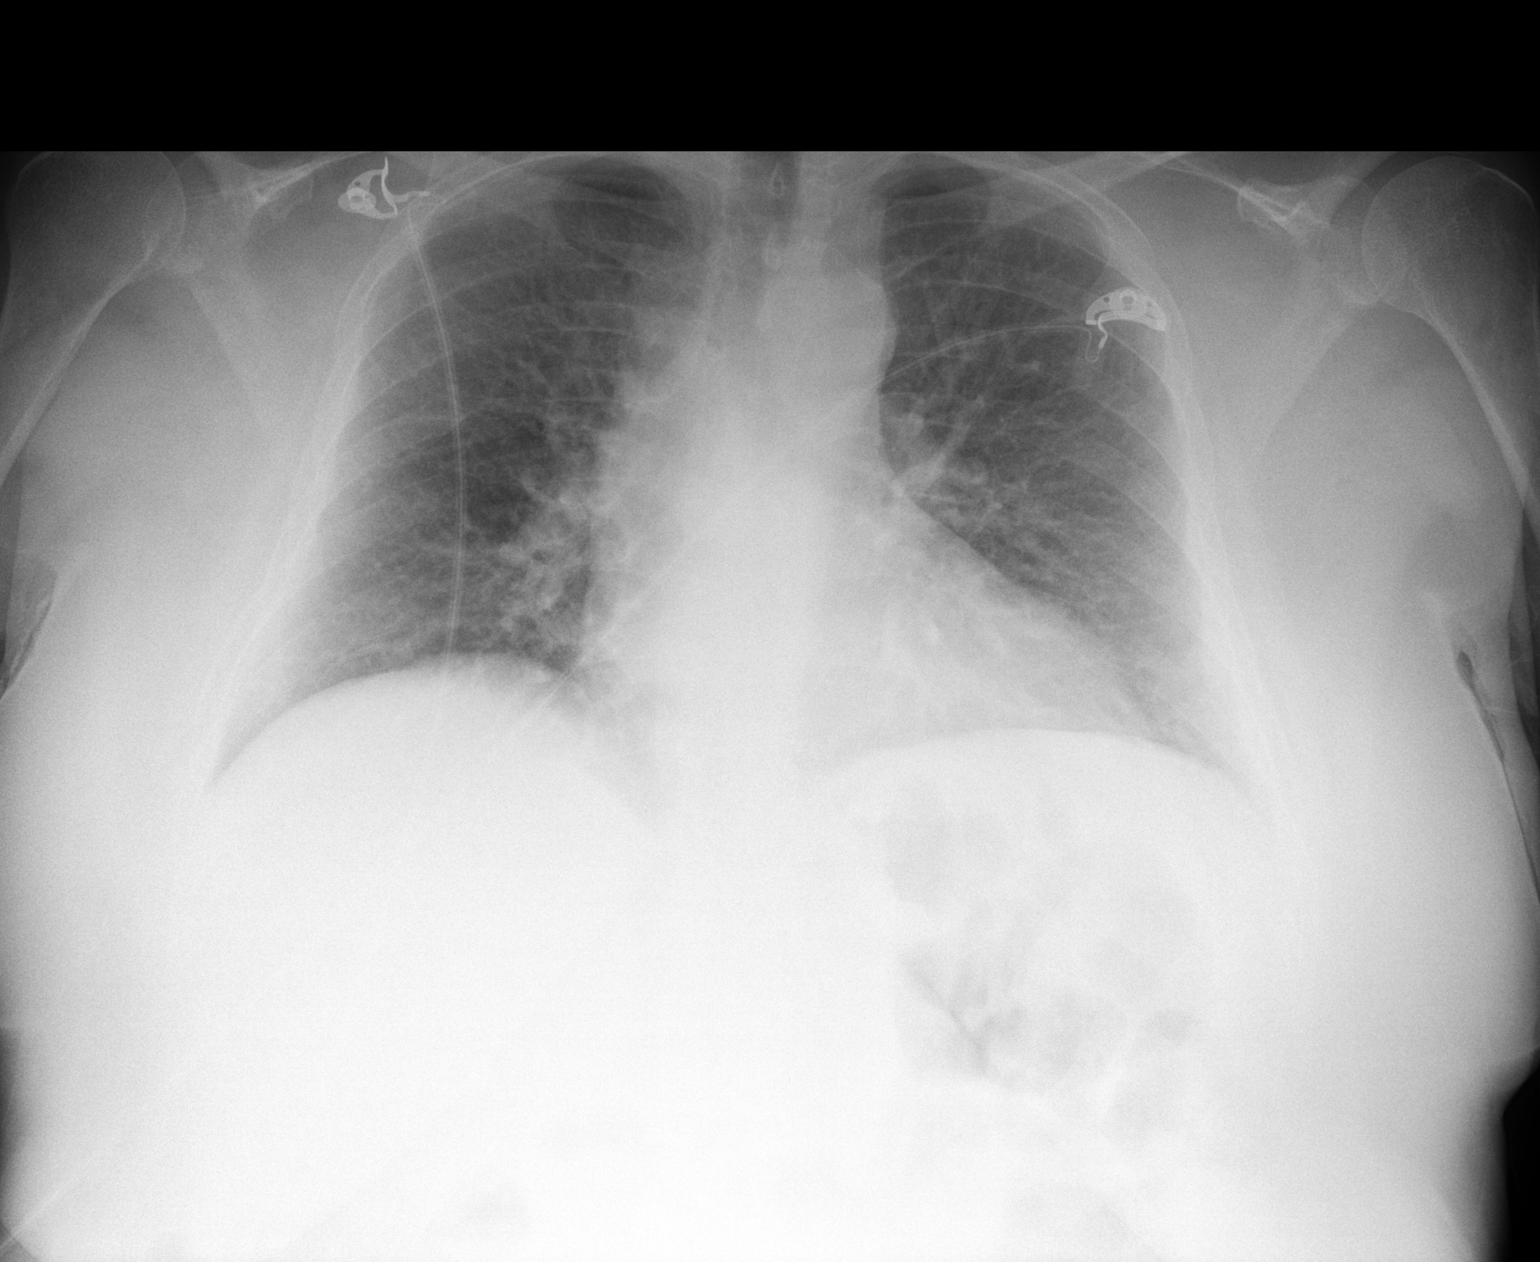

[1 of 1 positions shown; findings below may reference images not displayed]

FINDINGS: The lungs are fully expanded and clear with no consolidation, pleural effusion, pneumothorax or mass.
The heart is normal in size. The pulmonary vascularity is normal. There are no visible mediastinal masses.
The bones are normal for age.
IMPRESSION: No acute cardiopulmonary disease.
LOCATION CODE: 4
Is the patient pregnant?
No

## 2021-08-03 IMAGING — MR MRI BRAIN WITHOUT CONTRAST
9 of 12 series · 33 of 48 positions shown · non-contrast
Comparison: None available

MS FOLLOW UP. PT USUALLY GOES TO THE [HOSPITAL]
FINAL REPORT:
INDICATION: Multiple sclerosis Pain
Procedure: MRI of the brain without contrast
TECHNIQUE: Multisequence, multiplanar imaging of the brain was performed.

[Series 2001: survey_mpr_sag · sagittal · 1.6mm · 1.60mm/px · 1 of 5 slices shown]
[im 1/5]
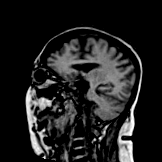

[Series 3001: survey_mpr_cor · coronal · 1.6mm · 1.60mm/px · 1 of 3 slices shown]
[im 1/3]
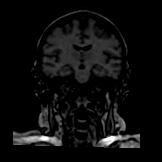

[Series 4001: survey_mpr_tra · axial · 1.6mm · 1.60mm/px · 1 of 3 slices shown]
[im 1/3]
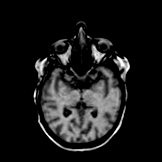

[Series 5001: T1 · sagittal · 5.0mm · 0.40mm/px · 5 of 27 slices shown (1 of 2)]
[im 1/27]
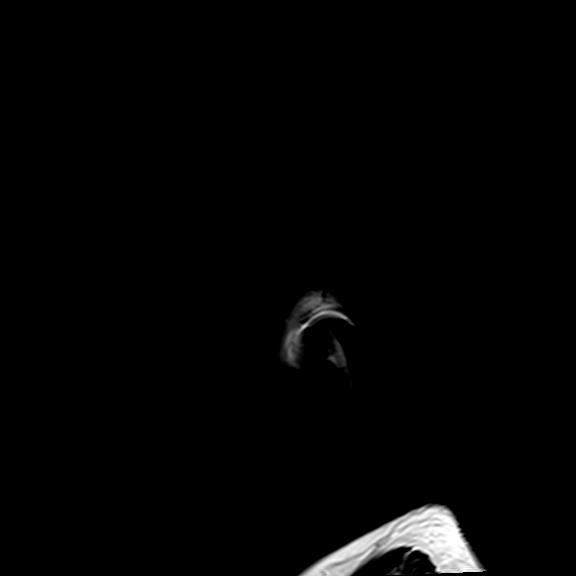
[im 7/27]
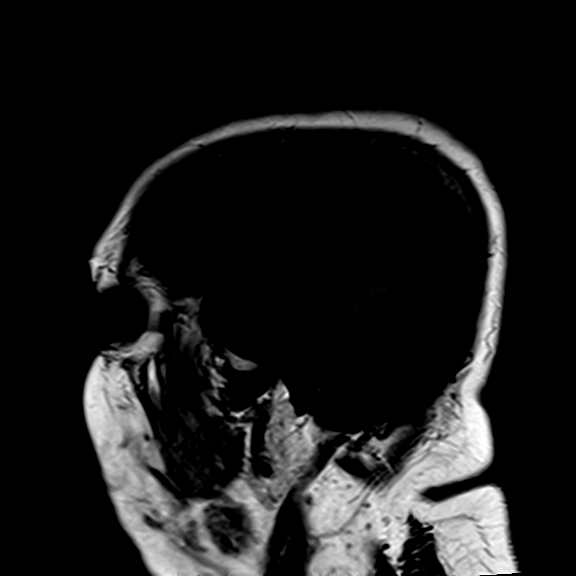
[im 14/27]
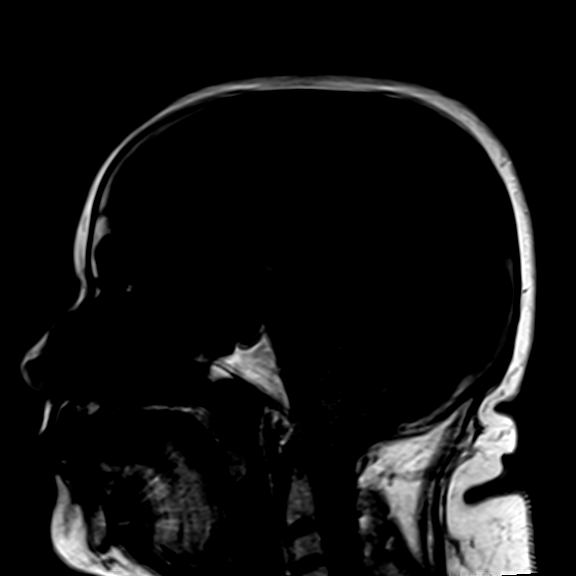
[im 20/27]
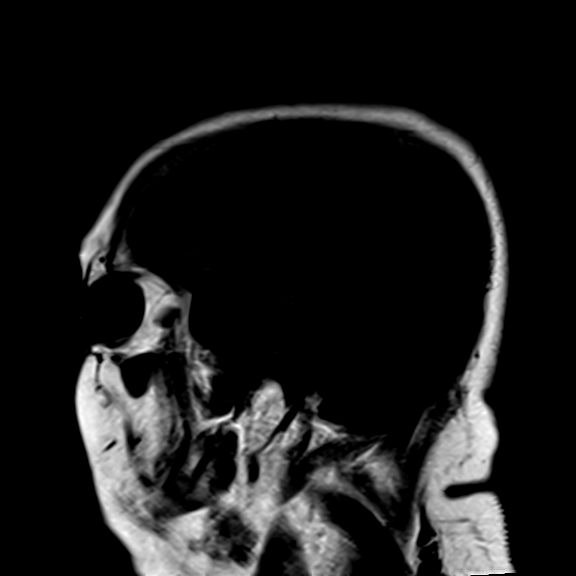
[im 27/27]
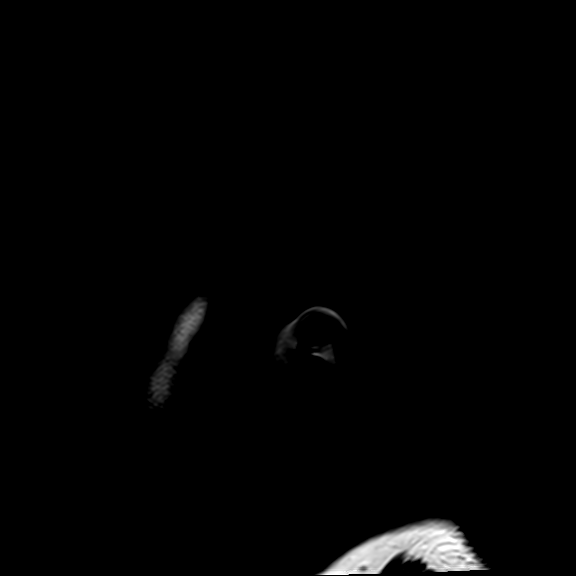

[Series 6001: FLAIR · sagittal · 5.0mm · 0.90mm/px · 5 of 27 slices shown (1 of 2)]
[im 1/27]
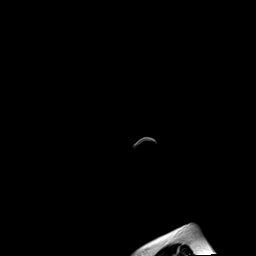
[im 7/27]
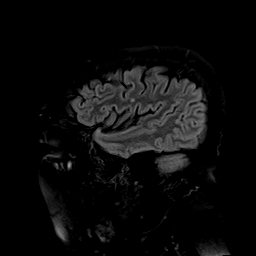
[im 14/27]
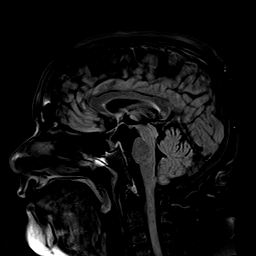
[im 20/27]
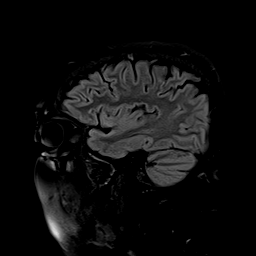
[im 27/27]
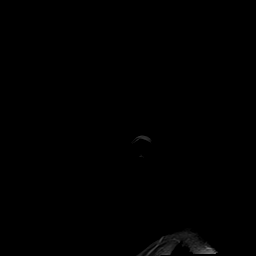

[T2 · axial · 5.0mm · 0.51mm/px · z∈[-50,+111]mm · 5 of 28 slices shown]
[im 1/28]
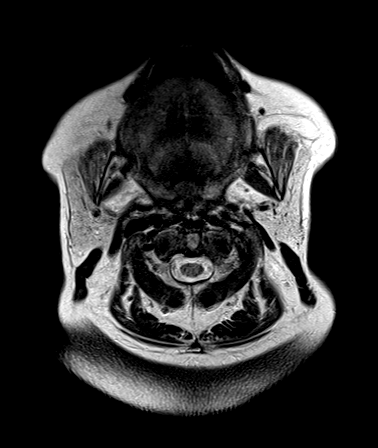
[im 7/28]
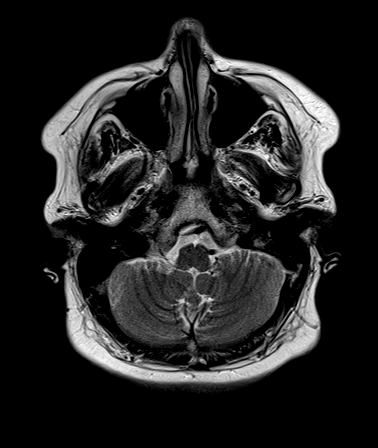
[im 14/28]
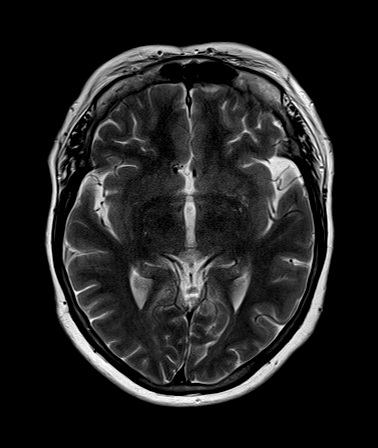
[im 21/28]
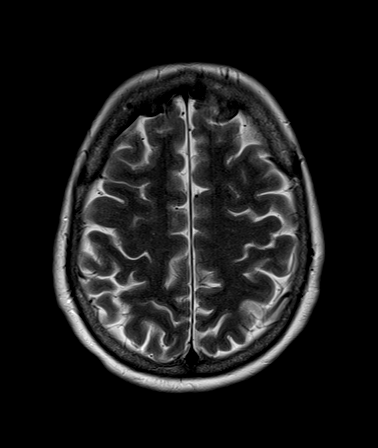
[im 28/28]
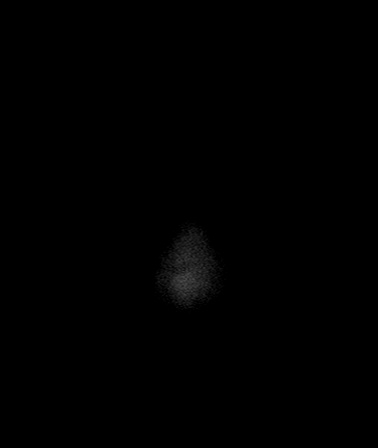

[T1 · axial · 5.0mm · 0.72mm/px · z∈[-50,+111]mm · 5 of 28 slices shown (2 of 2)]
[im 1/28]
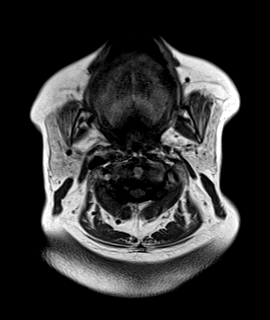
[im 7/28]
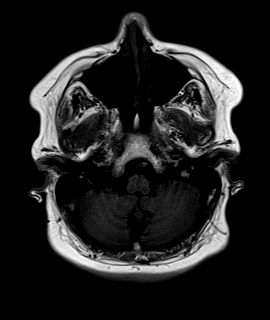
[im 14/28]
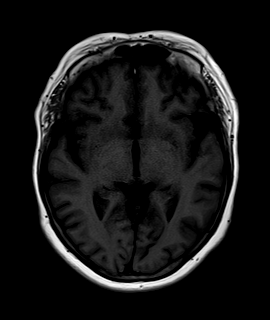
[im 21/28]
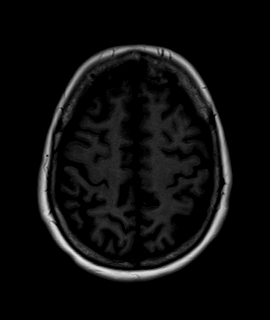
[im 28/28]
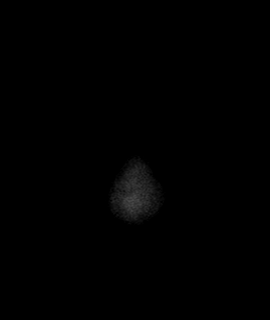

[FLAIR · axial · 5.0mm · 0.72mm/px · z∈[-50,+111]mm · 5 of 28 slices shown (2 of 2)]
[im 1/28]
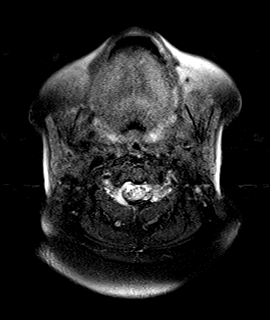
[im 7/28]
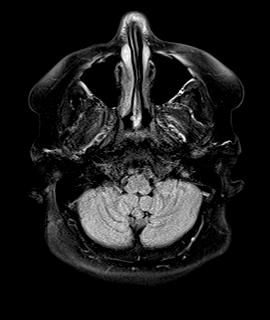
[im 14/28]
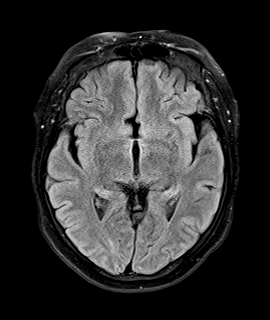
[im 21/28]
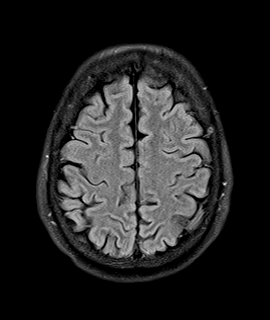
[im 28/28]
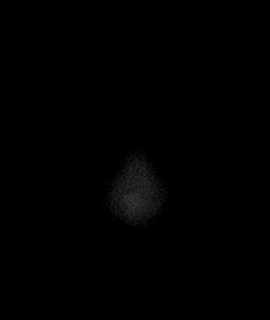

[GRE · axial · 5.0mm · 0.45mm/px · z∈[-50,+111]mm · 5 of 28 slices shown]
[im 1/28]
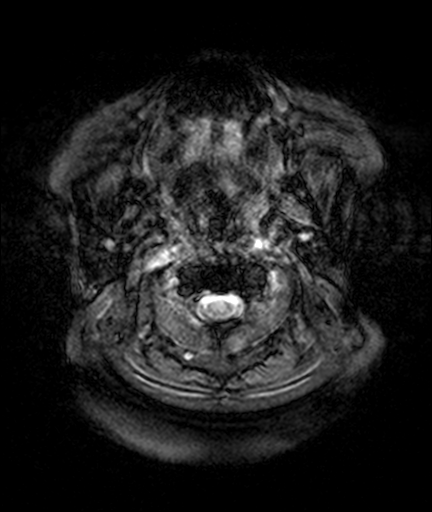
[im 7/28]
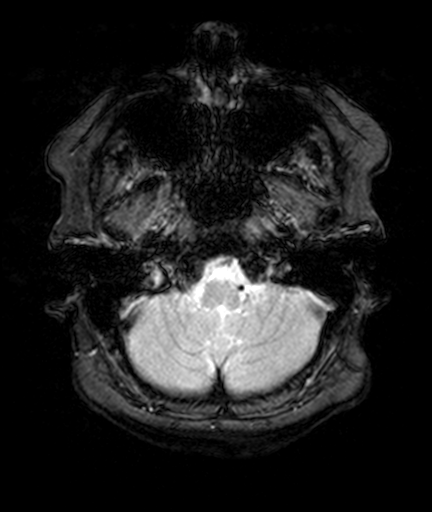
[im 14/28]
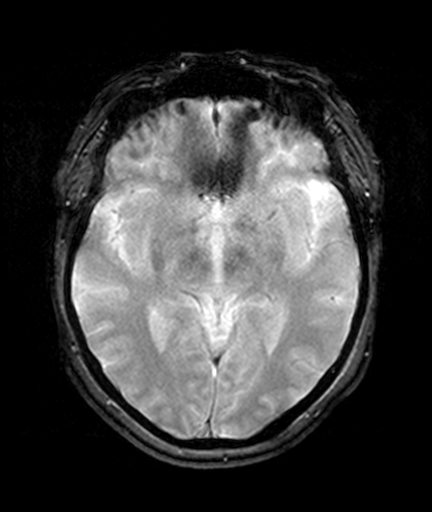
[im 21/28]
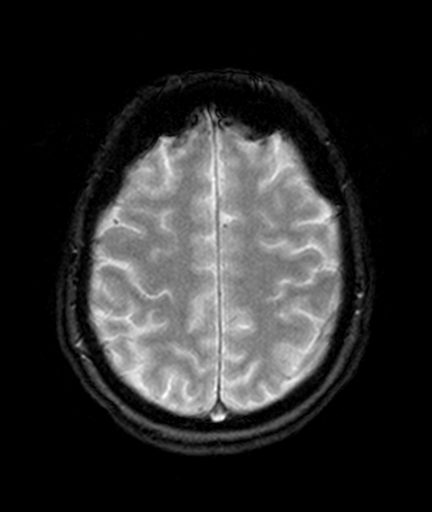
[im 28/28]
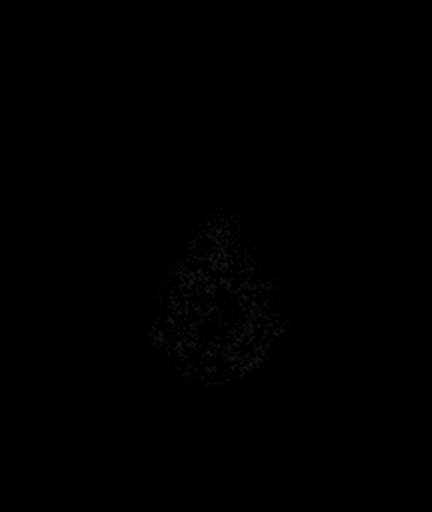

[33 of 48 positions shown; findings below may reference images not displayed]

FINDINGS: On the sagittal T1-weighted images, there is a normal appearance of the midline structures including the corpus callosum, tectal plate, cerebellar tonsils, sella turcica and clivus, and midline dural venous sinuses minimal degenerative hyperintensities due to callosal septal interface on the right posteriorly. On the sagittal T1-weighted images, there is a normal appearance of the midline structures including the corpus callosum, tectal plate, cerebellar tonsils, sella turcica and clivus, and midline dural venous sinuses. No restricted diffusion. No abnormal susceptibility. The gray and white matter otherwise normal in signal intensity and differentiation. No mass or fluid collection. Intracranial flow voids are unremarkable.
IMPRESSION: 
IMPRESSION: 1. No acute intracranial findings
2. Minimal T2 hyperintensities which are compatible with a history of demyelinating disease
Is the patient pregnant?
Unknown

## 2023-01-10 IMAGING — MR MRI CERVICAL SPINE WITHOUT CONTRAST
8 series · 48 of 48 positions shown · non-contrast
Comparison: none

MULTIPLE SCLEROSIS
FINAL REPORT:
HISTORY: Multiple sclerosis.
MRI cervical spine without contrast.
TECHNIQUE: Multiplanar multisequence MR imaging obtained of the cervical spine without contrast.

[Series 3001: survey c_mpr_sag · sagittal · 1.7mm · 1.67mm/px · 5 of 15 slices shown]
[im 1/15]
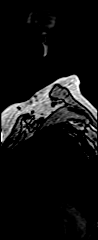
[im 4/15]
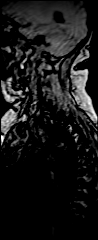
[im 8/15]
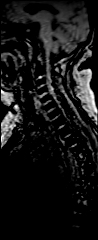
[im 11/15]
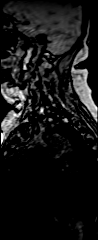
[im 15/15]
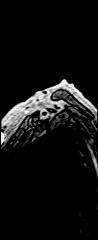

[Series 4001: survey c_mpr_(person_name) · axial · 1.7mm · 1.67mm/px · z∈[-307,-7]mm · 2 of 7 slices shown]
[im 1/7]
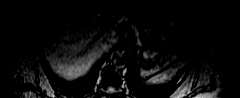
[im 7/7]
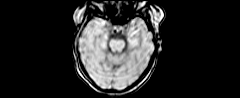

[Series 5001: T2 · sagittal · 3.0mm · 0.69mm/px · 4 of 15 slices shown (1 of 2)]
[im 1/15]
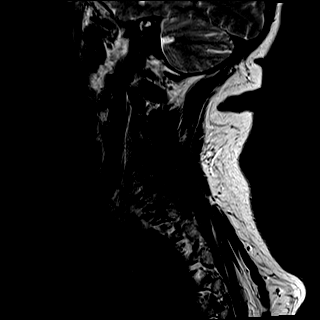
[im 5/15]
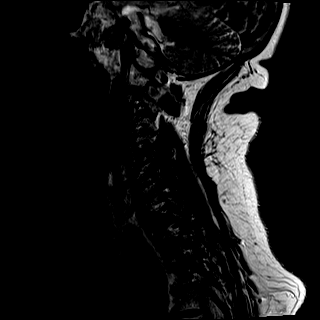
[im 10/15]
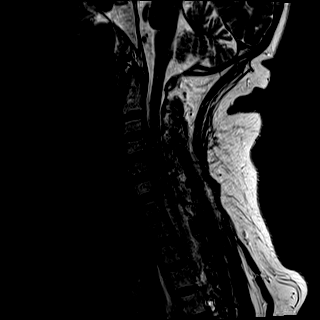
[im 15/15]
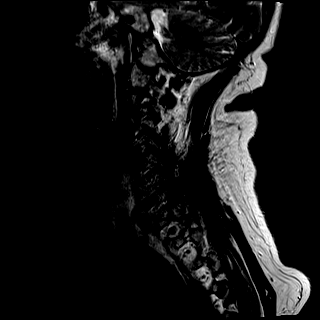

[Series 6001: T1 · sagittal · 3.0mm · 0.62mm/px · 4 of 15 slices shown (1 of 2)]
[im 1/15]
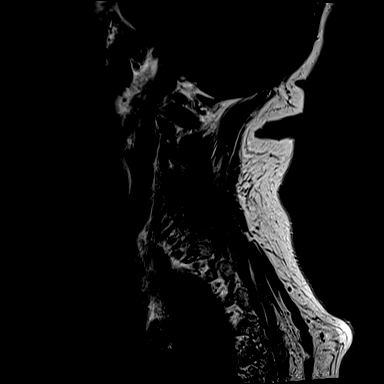
[im 5/15]
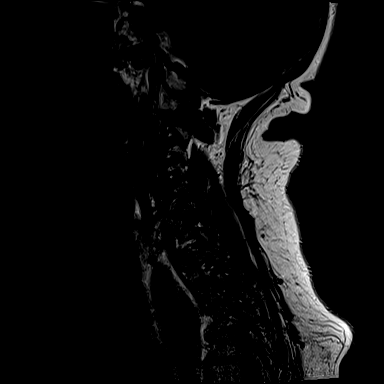
[im 10/15]
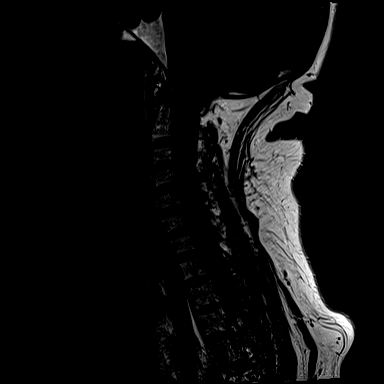
[im 15/15]
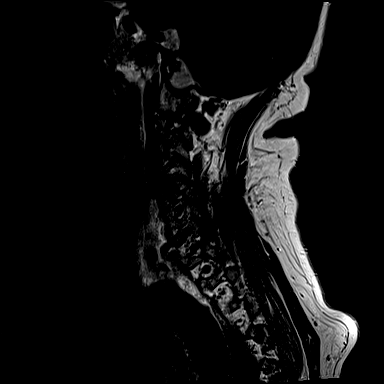

[Series 7001: STIR · sagittal · 3.0mm · 0.43mm/px · 4 of 15 slices shown]
[im 1/15]
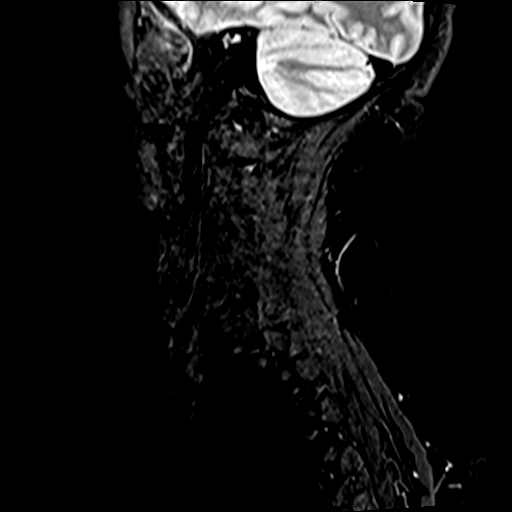
[im 5/15]
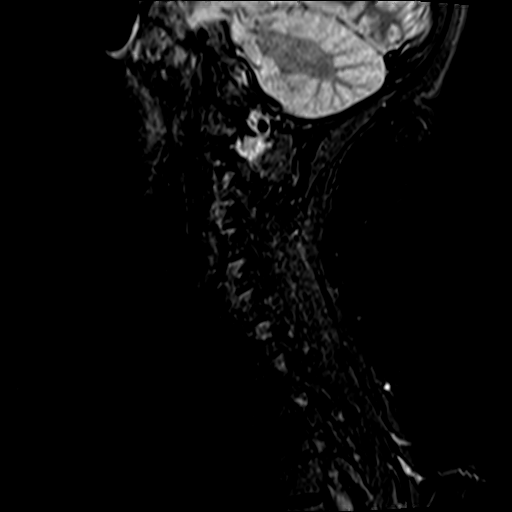
[im 10/15]
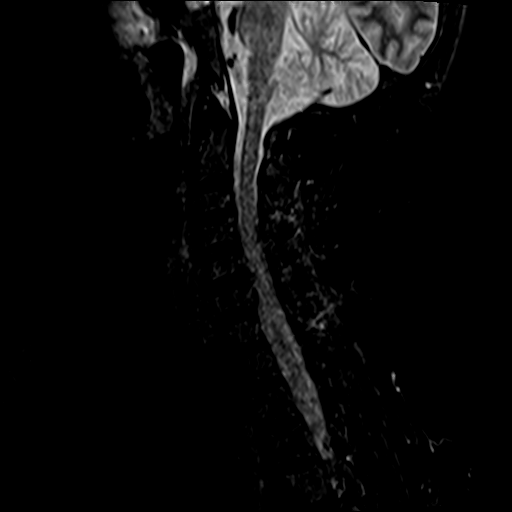
[im 15/15]
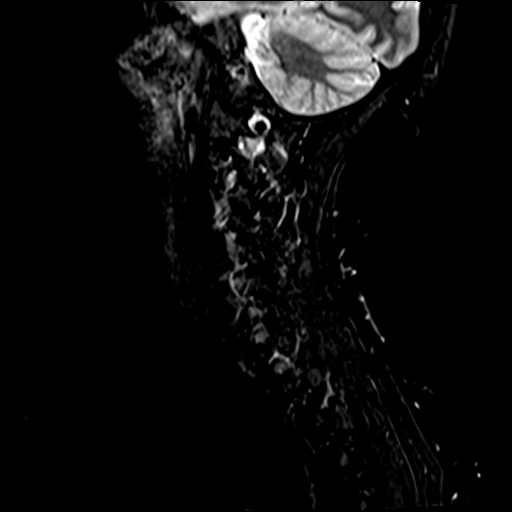

[Series 8001: GRE · axial · 3.0mm · 0.94mm/px · z∈[-184,-98]mm · 8 of 28 slices shown]
[im 1/28]
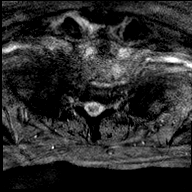
[im 4/28]
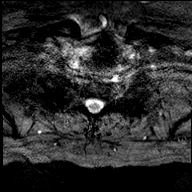
[im 8/28]
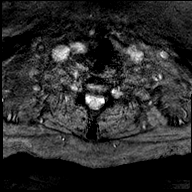
[im 12/28]
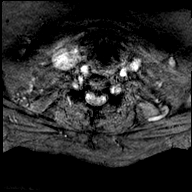
[im 16/28]
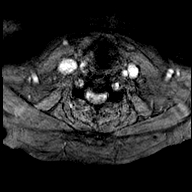
[im 20/28]
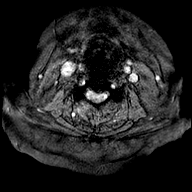
[im 24/28]
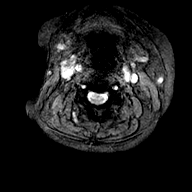
[im 28/28]
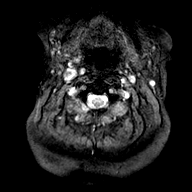

[Series 9001: T2 · axial · 3.0mm · 0.70mm/px · z∈[-192,-80]mm · 11 of 40 slices shown (2 of 2)]
[im 1/40]
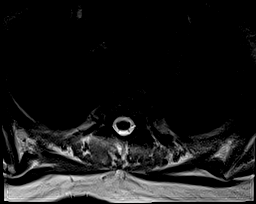
[im 4/40]
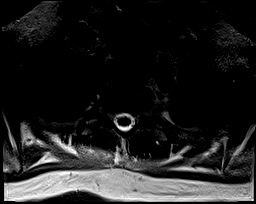
[im 8/40]
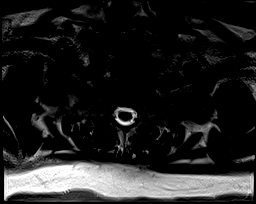
[im 12/40]
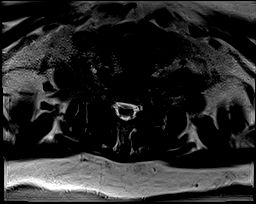
[im 16/40]
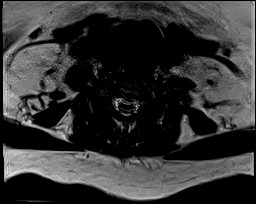
[im 20/40]
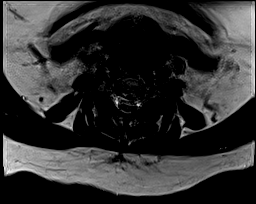
[im 24/40]
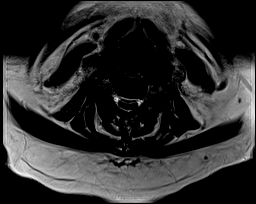
[im 28/40]
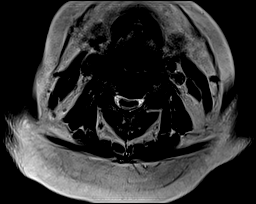
[im 32/40]
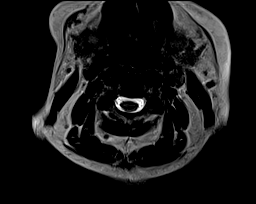
[im 36/40]
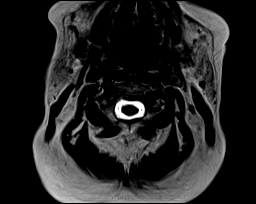
[im 40/40]
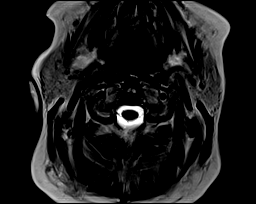

[T1 · axial · non-contrast · 3.0mm · 0.47mm/px · z∈[-198,-84]mm · 10 of 37 slices shown (2 of 2)]
[im 1/37]
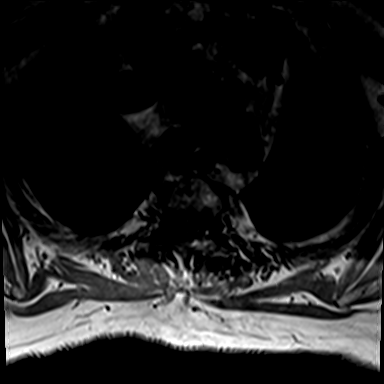
[im 5/37]
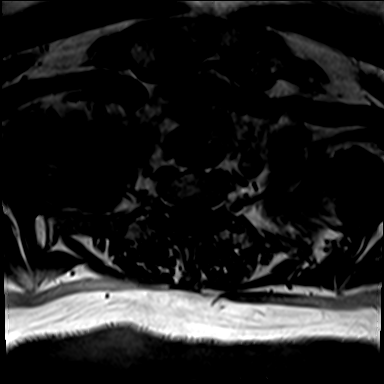
[im 9/37]
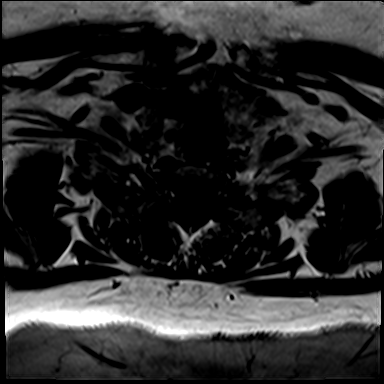
[im 13/37]
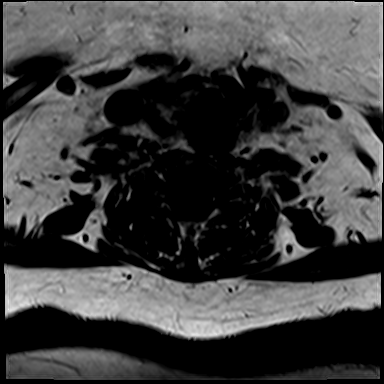
[im 17/37]
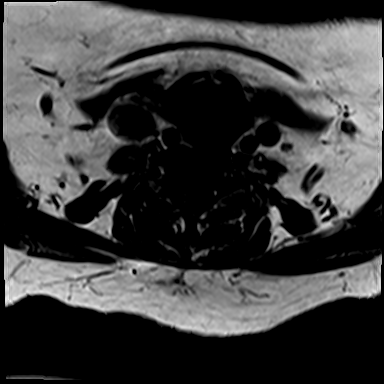
[im 21/37]
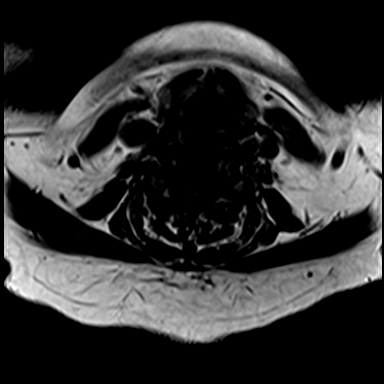
[im 25/37]
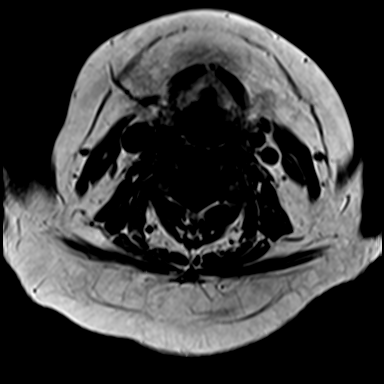
[im 29/37]
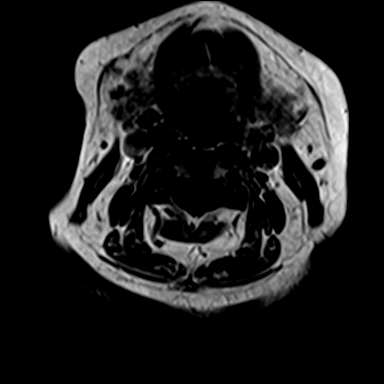
[im 33/37]
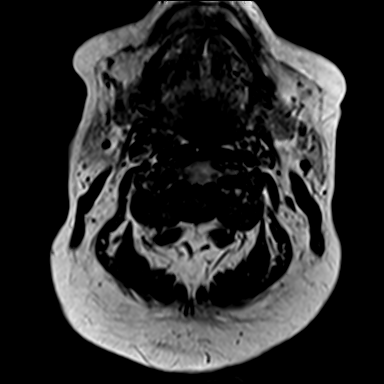
[im 37/37]
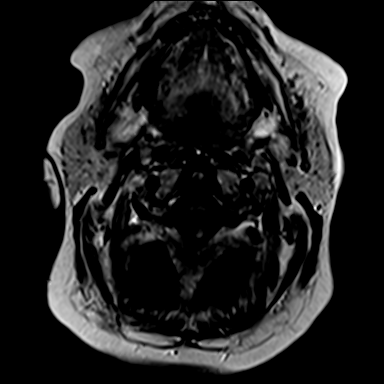

[48 of 48 positions shown; findings below may reference images not displayed]

FINDINGS: Sagittal imaging performed from occiput through inferior T4. Minimal degenerative endplate changes and disc height loss C3-4 and C5-6. Vertebral body height, shape, signal, and alignment are otherwise maintained. Mild to joint facet arthropathy best seen on the left C5-6. The facet joints are aligned. The craniovertebral junction is satisfactory in appearance. The cervical cord is satisfactory and contour and signal intensity.
Axial MR imaging demonstrates the following:
C2-3: No disc herniation. No central spinal or neuroforaminal stenosis.
C3-4: Central disc protrusion with mild central spinal stenosis. No neuroforaminal stenosis.
C4-5: No disc herniation. No central spinal or neuroforaminal stenosis.
C5-6: Central disc protrusion with moderate central spinal stenosis. No neuroforaminal stenosis.
C6-7: No disc herniation. No central spinal or neuroforaminal stenosis.
C7-T1: No disc herniation. No central spinal or neuroforaminal stenosis.
IMPRESSION: 1. C5-6 Central disc protrusion with moderate central spinal stenosis.
2. C3-4 Central disc protrusion with mild central spinal stenosis.
3. No cord signal abnormality in the cervical spine.
Is the patient pregnant?
No

## 2023-01-10 IMAGING — MR MRI BRAIN WITHOUT CONTRAST
11 of 12 series · 40 of 48 positions shown · non-contrast
Comparison: none

MULTIPLE SCLEROSIS
FINAL REPORT:
HISTORY: Multiple sclerosis.
MRI brain without contrast.
TECHNIQUE: Multiplanar-multisequence MR imaging obtained of the brain without contrast.

[Series 2001: survey br_mpr_sag · sagittal · 1.6mm · 1.60mm/px · 1 of 5 slices shown]
[im 1/5]
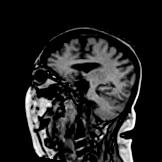

[Series 3001: survey br_mpr_cor · coronal · 1.6mm · 1.60mm/px · 1 of 3 slices shown]
[im 1/3]
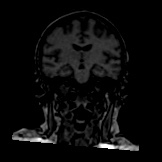

[Series 4001: survey br_mpr_tra · axial · 1.6mm · 1.60mm/px · 1 of 3 slices shown]
[im 1/3]
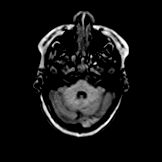

[Series 5001: T1 · sagittal · 5.0mm · 0.78mm/px · 5 of 25 slices shown (1 of 2)]
[im 1/25]
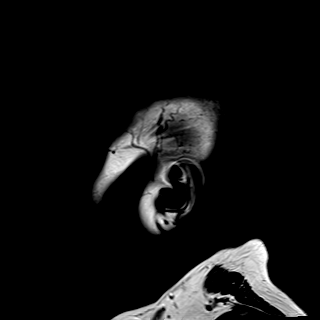
[im 7/25]
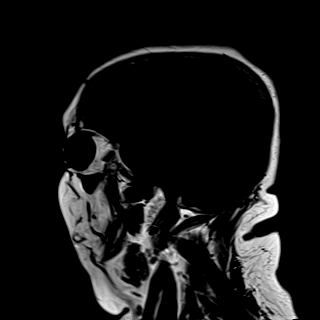
[im 13/25]
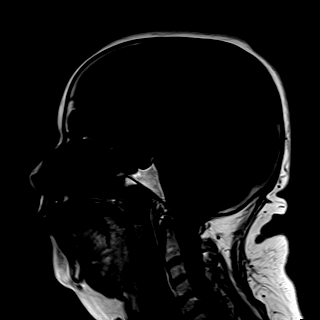
[im 19/25]
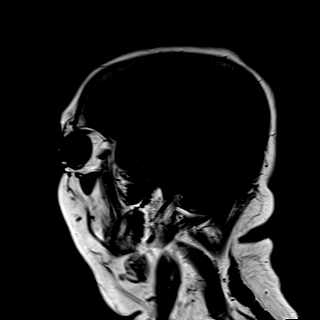
[im 25/25]
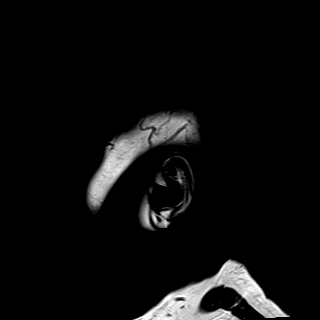

[Series 6002: ax dwi_tracew · axial · 5.0mm · 0.60mm/px · z∈[-72,+89]mm · 5 of 28 slices shown]
[im 1/28]
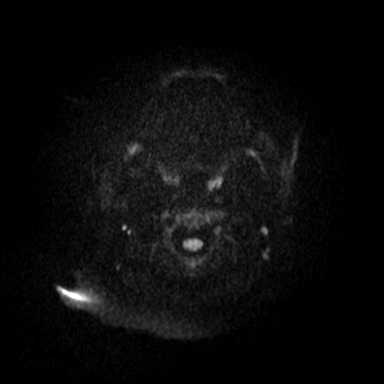
[im 7/28]
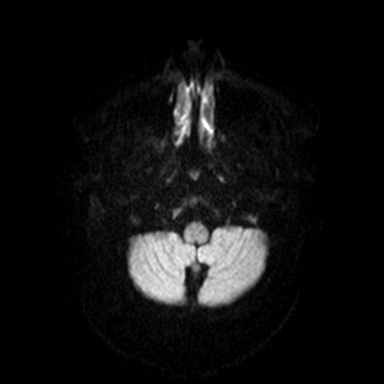
[im 14/28]
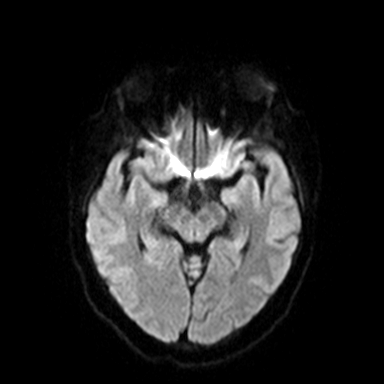
[im 21/28]
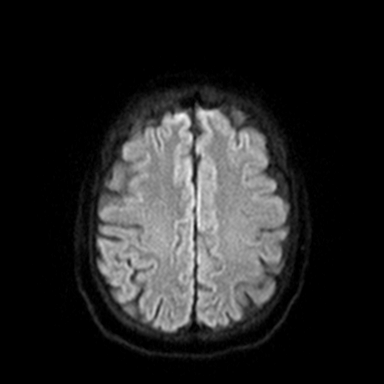
[im 28/28]
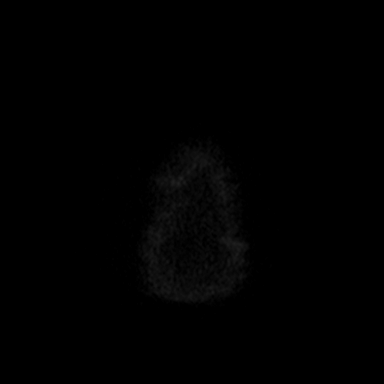

[Series 7001: ax dwi_adc · axial · 5.0mm · 0.60mm/px · z∈[-72,-37]mm · 2 of 28 slices shown]
[im 1/28]
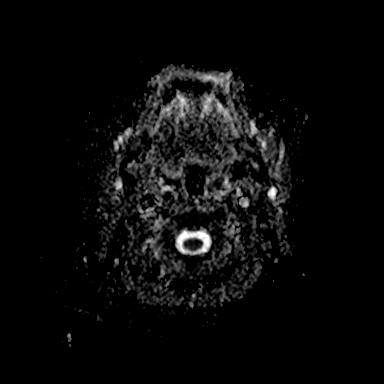
[im 7/28]
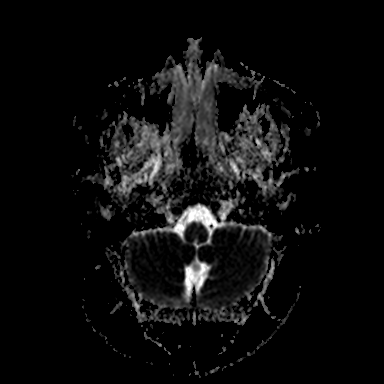

[Series 9001: FLAIR · axial · 5.0mm · 0.72mm/px · z∈[-71,+90]mm · 5 of 28 slices shown (1 of 2)]
[im 1/28]
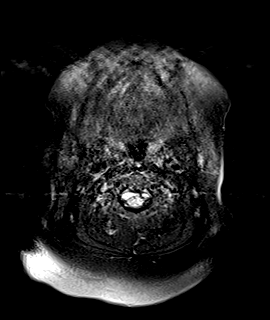
[im 7/28]
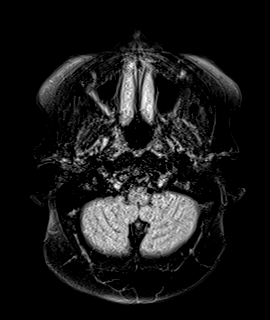
[im 14/28]
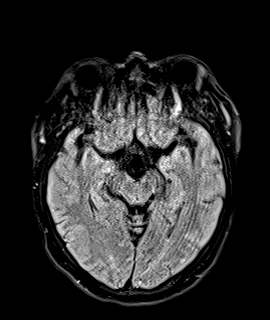
[im 21/28]
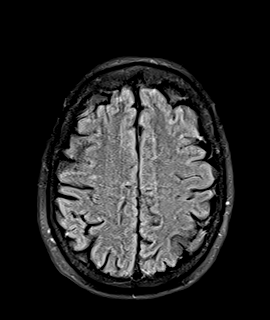
[im 28/28]
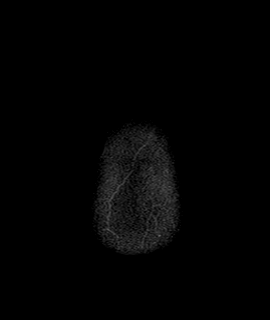

[T2 fat-sat · axial · 5.0mm · 0.51mm/px · z∈[-71,+90]mm · 5 of 28 slices shown]
[im 1/28]
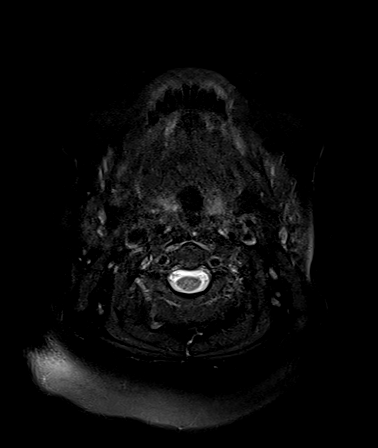
[im 7/28]
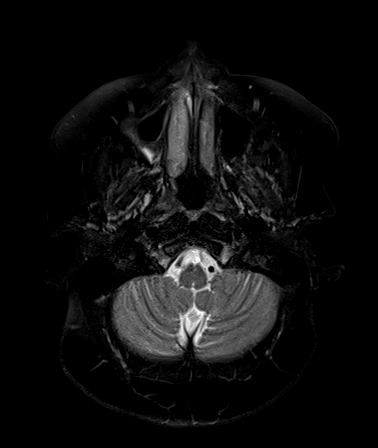
[im 14/28]
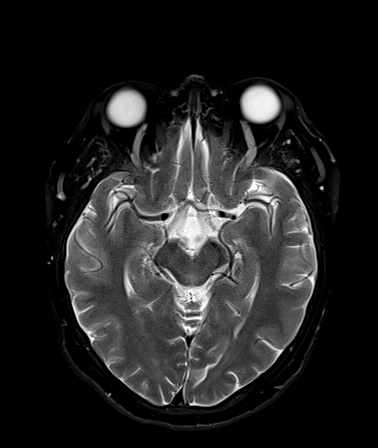
[im 21/28]
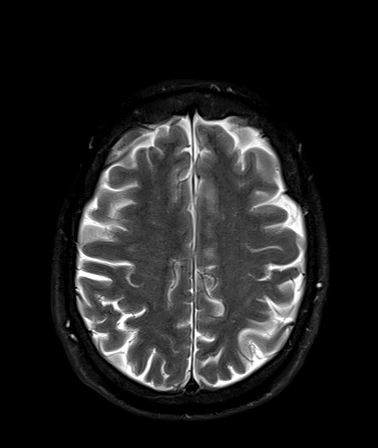
[im 28/28]
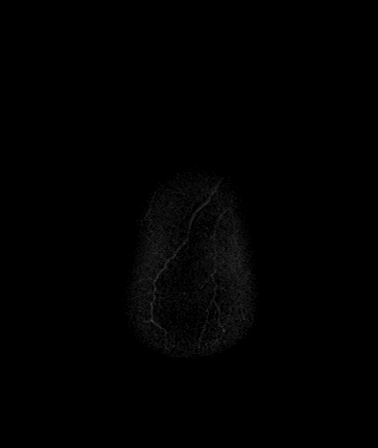

[T1 · axial · 5.0mm · 0.72mm/px · z∈[-71,+90]mm · 5 of 28 slices shown (2 of 2)]
[im 1/28]
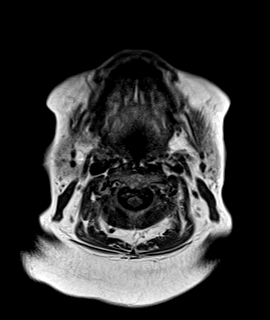
[im 7/28]
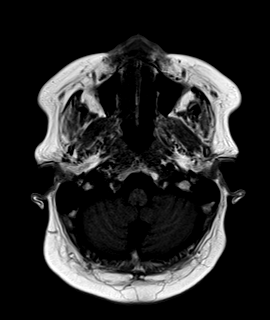
[im 14/28]
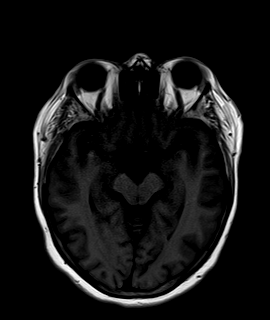
[im 21/28]
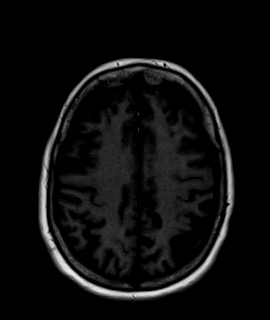
[im 28/28]
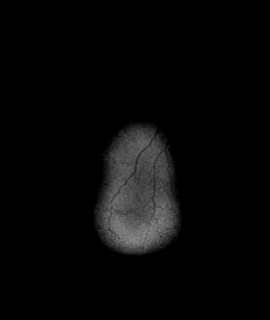

[GRE · axial · 5.0mm · 0.45mm/px · z∈[-71,+90]mm · 5 of 28 slices shown]
[im 1/28]
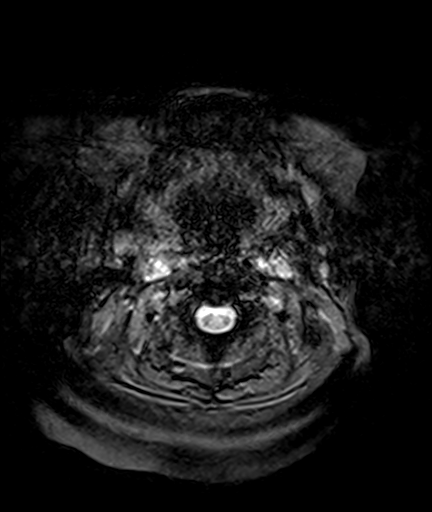
[im 7/28]
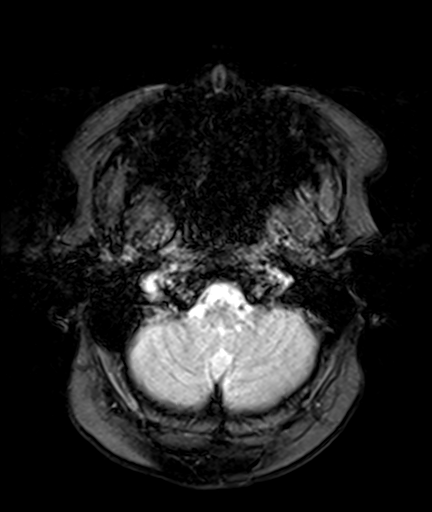
[im 14/28]
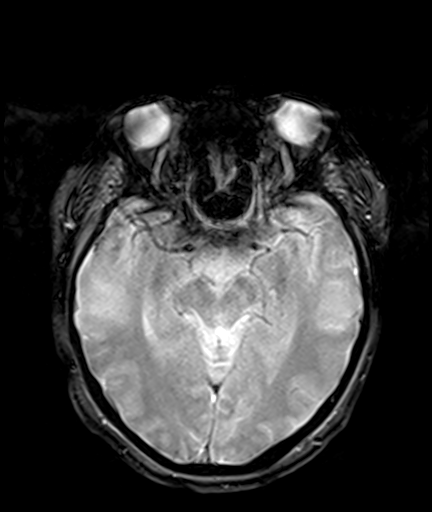
[im 21/28]
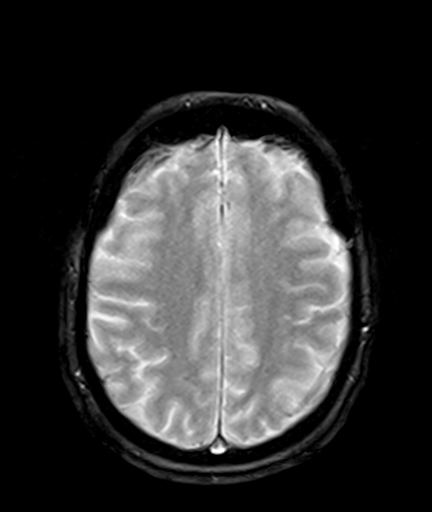
[im 28/28]
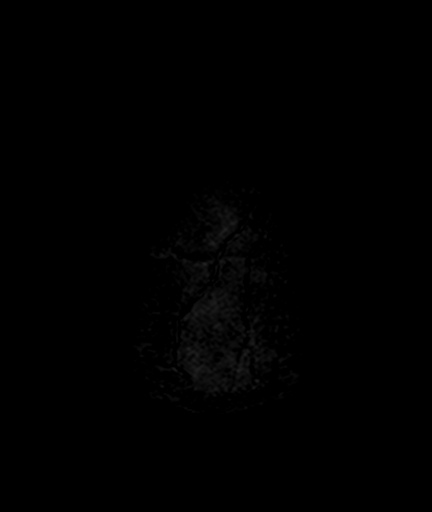

[FLAIR · sagittal · 5.0mm · 0.90mm/px · 5 of 25 slices shown (2 of 2)]
[im 1/25]
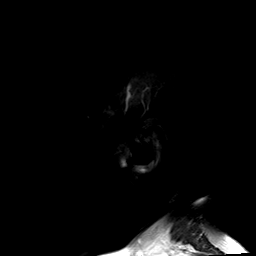
[im 7/25]
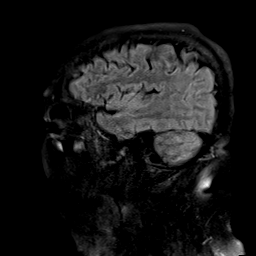
[im 13/25]
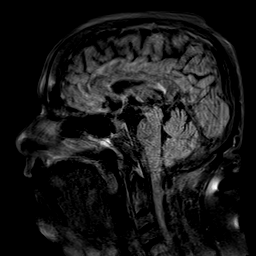
[im 19/25]
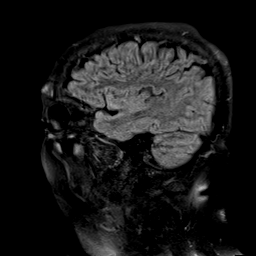
[im 25/25]
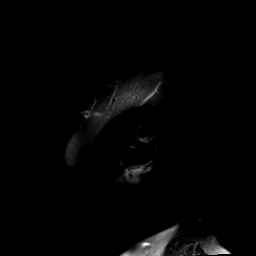

[40 of 48 positions shown; findings below may reference images not displayed]

FINDINGS: Prior: MRI brain without contrast 08/03/2021.
No restricted diffusion or associated ADC mapping defect to suggest acute or early subacute infarction. No intracranial hemorrhage. Minimal periventricular and bifrontal and parietal foci of T2 prolongation unchanged since previous exam. A few of these foci are at the septocallosal interface compatible with demyelinating disease. No cerebral or cerebellar mass or mass effect, midline shift, or extra-axial fluid collection. The ventricles are symmetrical without hydrocephalus. The larger intracranial flow voids are visualized. The basilar cisterns and aqueducts are maintained. Bone marrow signal intensity is preserved in the skull base and calvarium. Sellar structures are maintained.
The orbits, paranasal sinuses, and mastoids are clear.
IMPRESSION: Stable findings with minimal periventricular and bifrontal and parietal foci of T2 prolongation compatible with history of demyelinating disease.
Is the patient pregnant?
No

## 2023-01-10 IMAGING — MR MRI LUMBAR SPINE WITHOUT CONTRAST
7 of 11 series · 36 of 48 positions shown · non-contrast
Comparison: none

LOW BACK PAIN
FINAL REPORT:
HISTORY: Low back pain.
MRI lumbar spine without contrast.
TECHNIQUE: Multiplanar multisequence MR imaging obtained of the lumbar spine without contrast.

[Series 3001: survey c_mpr_sag · sagittal · 1.7mm · 1.67mm/px · 3 of 15 slices shown]
[im 1/15]
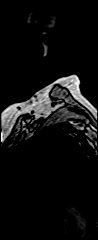
[im 8/15]
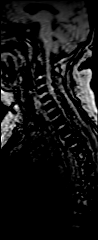
[im 15/15]
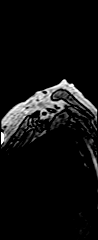

[Series 4001: survey c_mpr_(person_name) · axial · 1.7mm · 1.67mm/px · 1 of 7 slices shown]
[im 1/7]
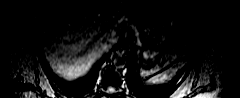

[T2 · sagittal · 4.0mm · 0.62mm/px · 4 of 17 slices shown (1 of 2)]
[im 1/17]
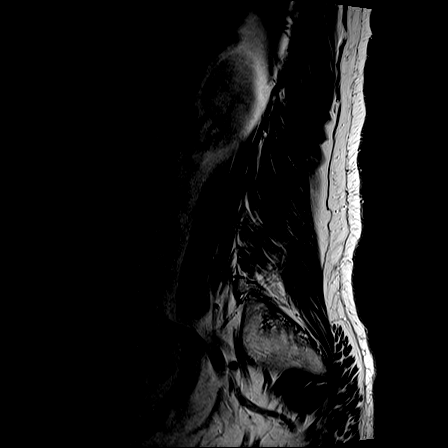
[im 6/17]
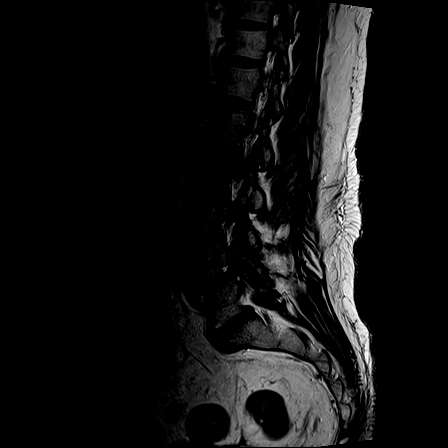
[im 11/17]
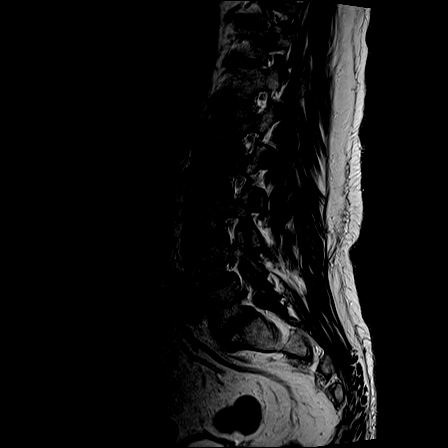
[im 17/17]
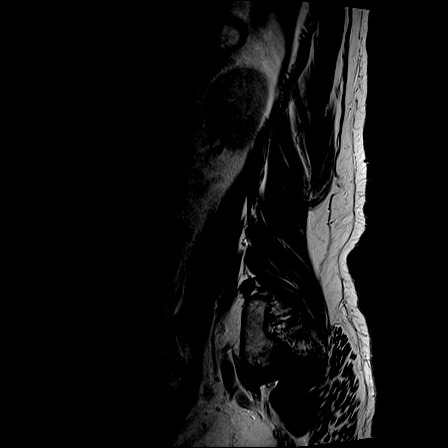

[STIR · sagittal · 4.0mm · 0.88mm/px · 4 of 17 slices shown]
[im 1/17]
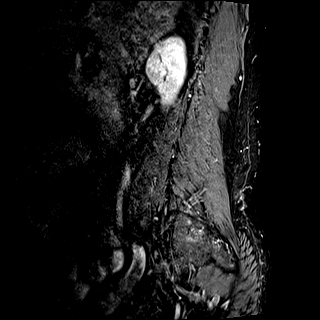
[im 6/17]
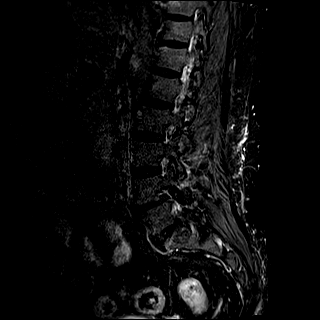
[im 11/17]
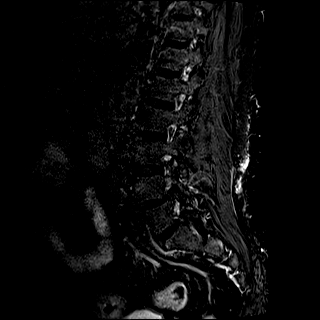
[im 17/17]
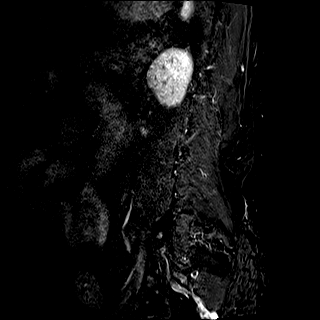

[T1 · sagittal · 4.0mm · 0.73mm/px · 4 of 17 slices shown (1 of 2)]
[im 1/17]
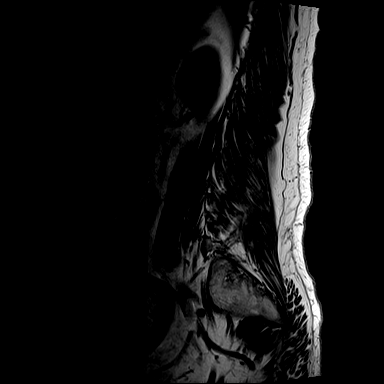
[im 6/17]
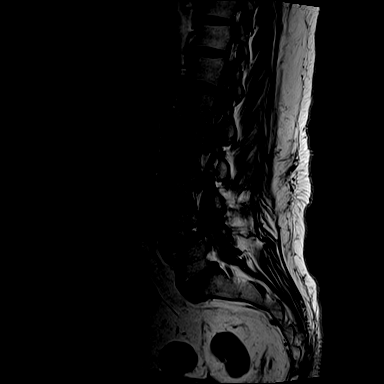
[im 11/17]
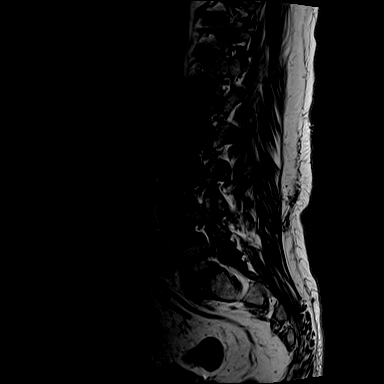
[im 17/17]
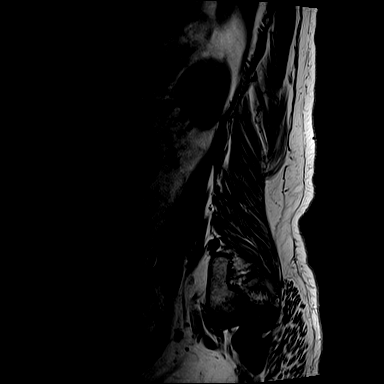

[T2 · axial · 4.0mm · 0.69mm/px · z∈[-2,+226]mm · 10 of 43 slices shown (2 of 2)]
[im 1/43]
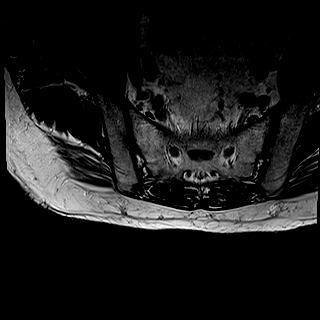
[im 5/43]
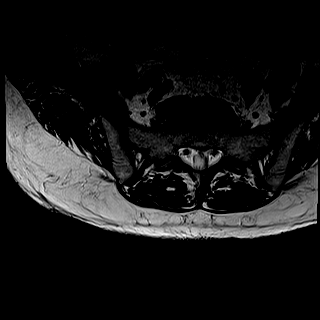
[im 10/43]
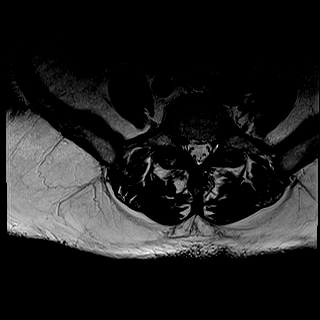
[im 15/43]
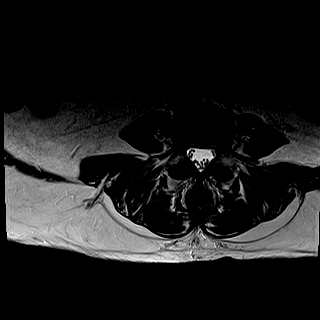
[im 19/43]
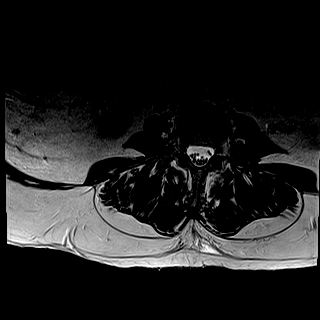
[im 24/43]
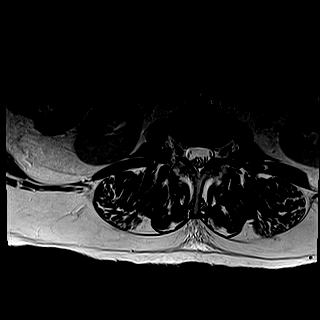
[im 29/43]
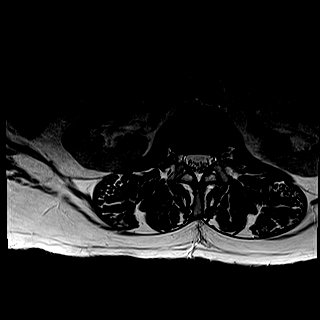
[im 33/43]
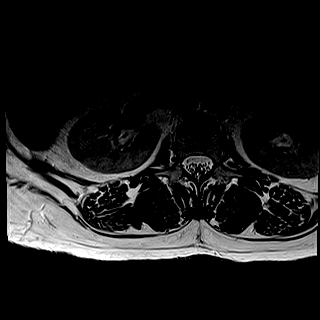
[im 38/43]
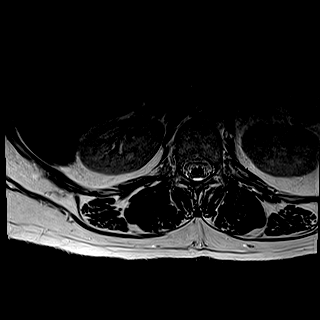
[im 43/43]
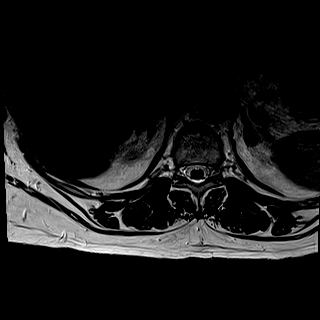

[T1 · axial · 4.0mm · 0.69mm/px · z∈[-2,+226]mm · 10 of 43 slices shown (2 of 2)]
[im 1/43]
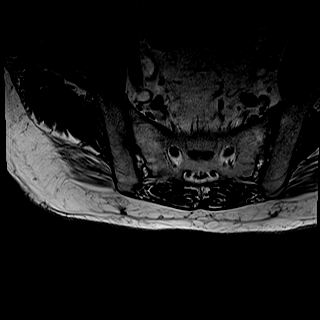
[im 5/43]
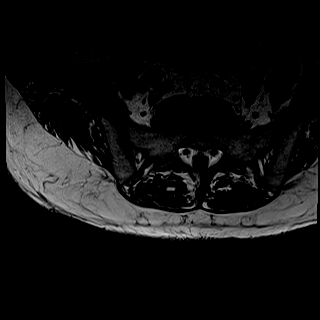
[im 10/43]
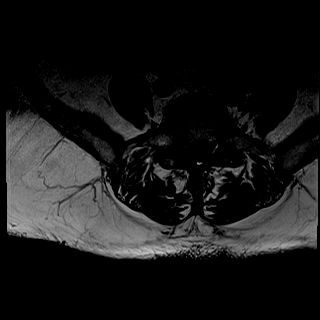
[im 15/43]
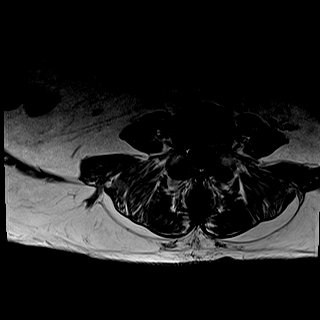
[im 19/43]
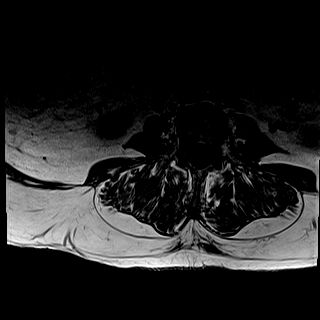
[im 24/43]
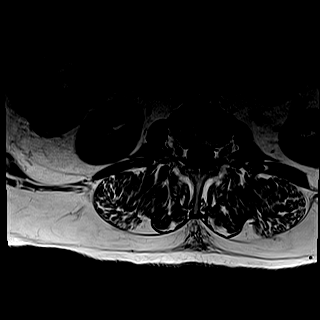
[im 29/43]
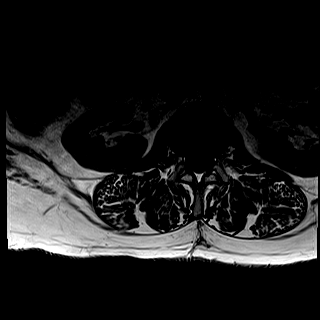
[im 33/43]
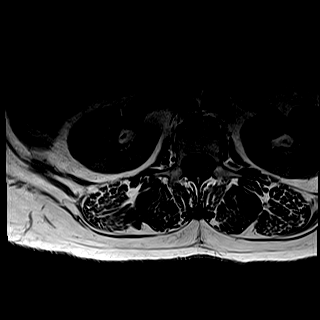
[im 38/43]
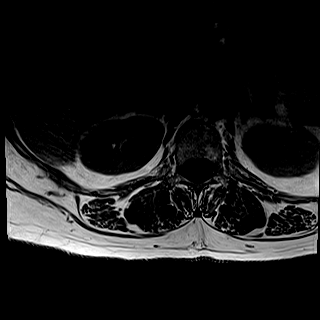
[im 43/43]
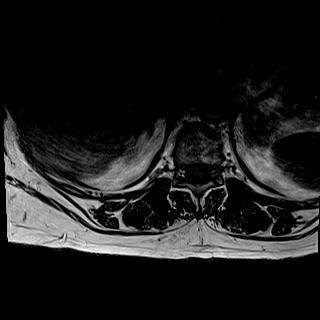

[36 of 48 positions shown; findings below may reference images not displayed]

FINDINGS: Sagittal imaging performed from mid T10 through sacrum. 6 mm of anterolisthesis of L4 on L5. Mild degenerative plate changes and disc height loss L1-2 and L5-S1. Mild to moderate degenerative plate changes and disc height loss L4-5. Vertebral body height, shape, signal, and alignment are otherwise maintained. Mild disc desiccation at most levels in the lumbar spine as well except for L3-4. The cord terminates normally with the conus medullaris posterior to the upper aspect of L1.
Axial MR imaging demonstrates the following:
T11-12: No disc herniation. No central spinal or neuroforaminal stenosis.
T12-L1: No disc herniation. No central spinal or neuroforaminal stenosis.
L1-2: Minimal noncompressive circumferential disc bulge with mild anterior effacement of thecal sac. No neuroforaminal stenosis.
L2-3: No disc herniation. No central spinal or neuroforaminal stenosis.
L3-4: No disc herniation. No central spinal or neuroforaminal stenosis.
L4-5: Moderate bilateral facet arthropathy with 6 mm degenerative anterolisthesis of L4 on L5. Partial uncovering of the L4-5 disc. Mild central spinal and mild to moderate bilateral neuroforaminal stenosis.
L5-S1: Noncompressive minimal central disc protrusion with mild anterior effacement of thecal sac. No significant neuroforaminal stenosis.
IMPRESSION: 1. 6 mm degenerative anterolisthesis L4 on L5 secondary to facet arthropathy.
2. Scattered degenerative plate, disc, and facet arthropathy in the lumbar spine. With central spinal and neuroforaminal stenosis not exceeding a mild degree except for mild to moderate bilateral neuroforaminal stenosis at L4-5.
Is the patient pregnant?
No

## 2023-07-03 IMAGING — MR MRI BRAIN WITHOUT CONTRAST
5 of 11 series · 27 of 48 positions shown · non-contrast
Comparison: MRI 01/10/2023.

FINAL REPORT:
EXAM: MRI BRAIN WITHOUT CONTRAST, MRI CERVICAL SPINE WITHOUT CONTRAST
CLINICAL INDICATION:  Multiple sclerosis.
TECHNIQUE: Multiplanar, multisequence imaging of the brain and cervical spine without contrast was performed using the multiple sclerosis protocol.

[Series 5001: T1 · sagittal · 5.0mm · 0.40mm/px · 5 of 27 slices shown (1 of 2)]
[im 1/27]
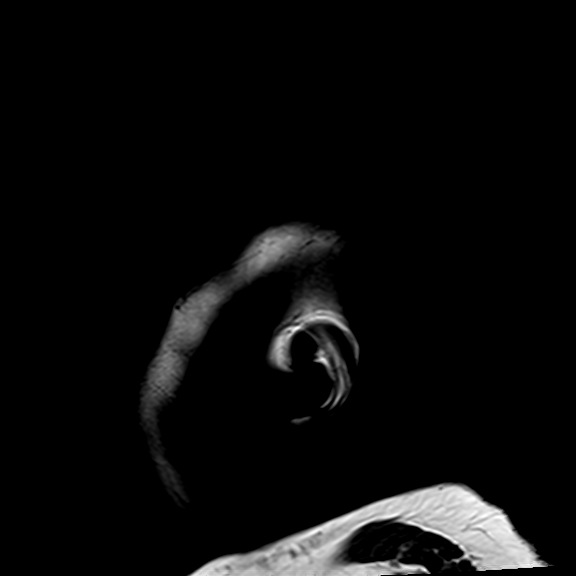
[im 7/27]
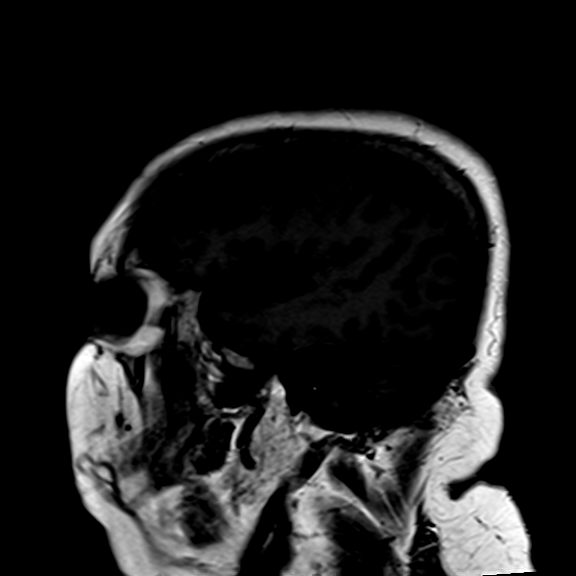
[im 14/27]
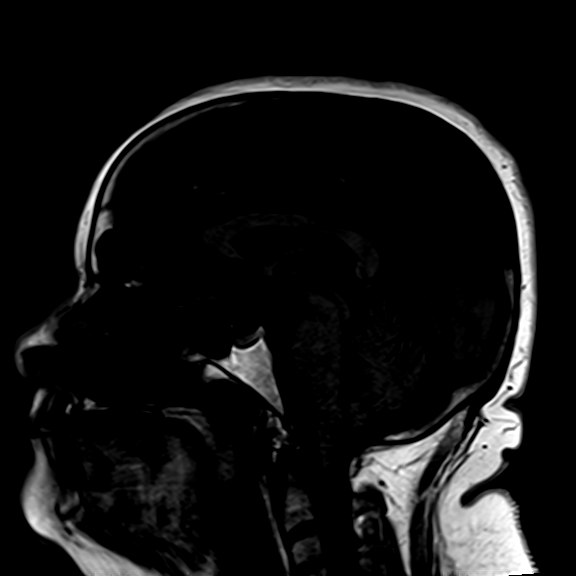
[im 20/27]
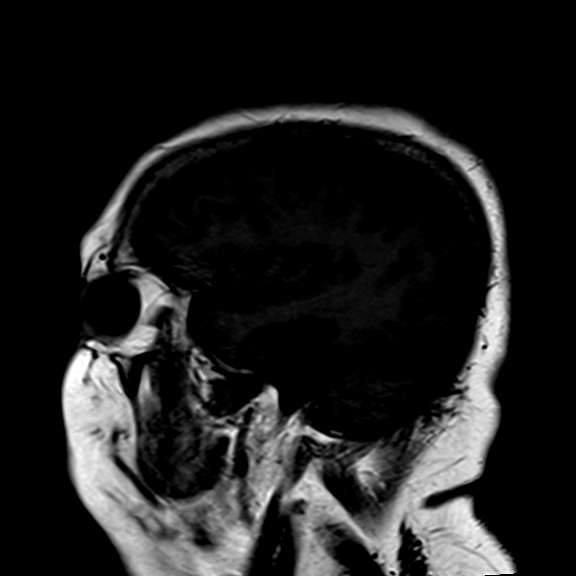
[im 27/27]
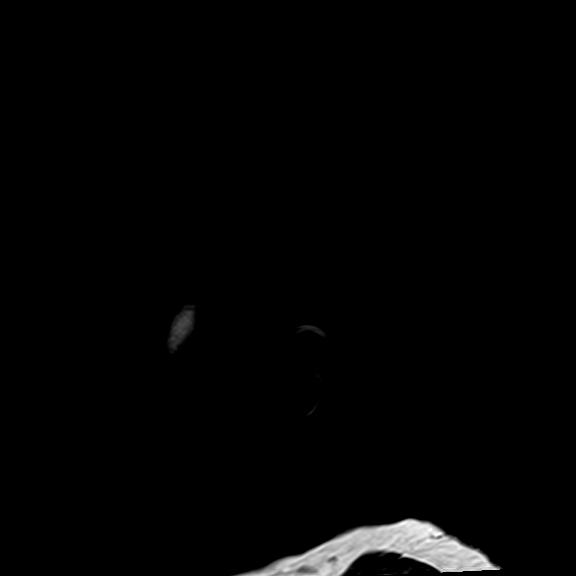

[Series 9001: T2 · axial · 5.0mm · 0.51mm/px · z∈[-62,+81]mm · 6 of 25 slices shown]
[im 1/25]
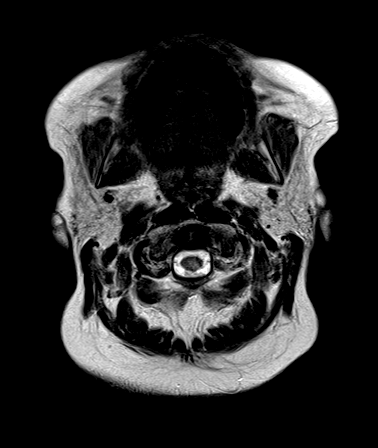
[im 5/25]
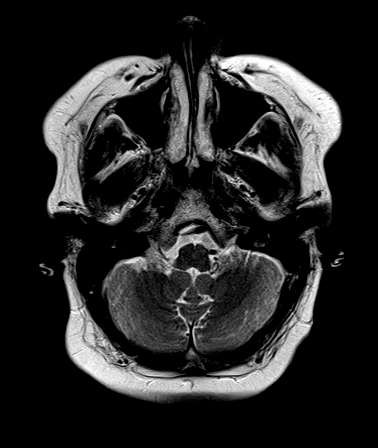
[im 10/25]
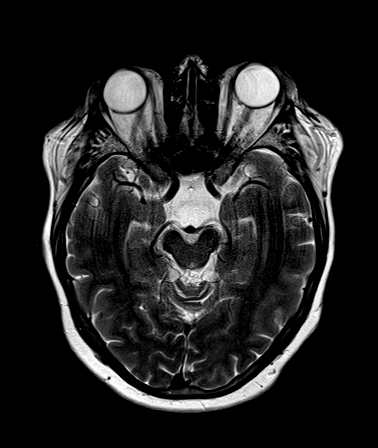
[im 15/25]
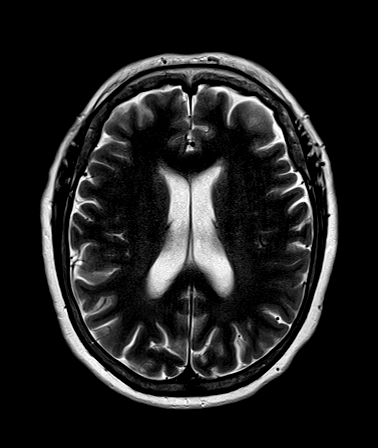
[im 20/25]
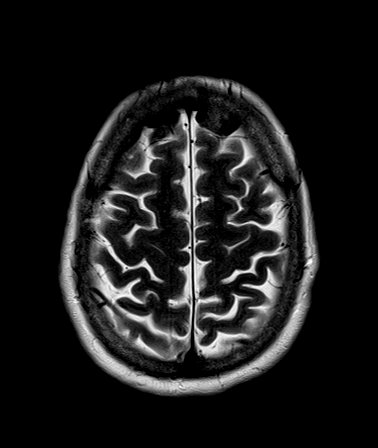
[im 25/25]
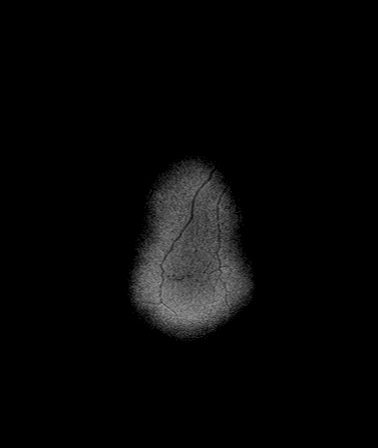

[GRE · axial · 5.0mm · 0.45mm/px · z∈[-62,+81]mm · 6 of 25 slices shown]
[im 1/25]
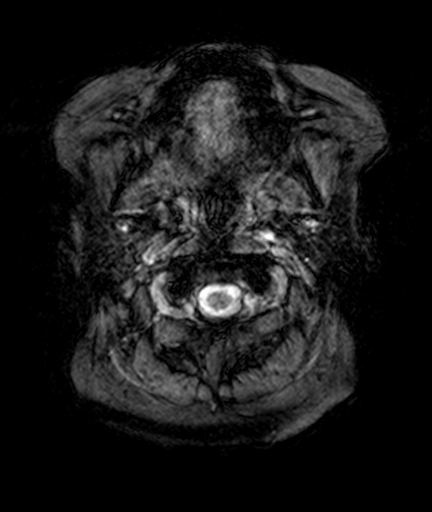
[im 5/25]
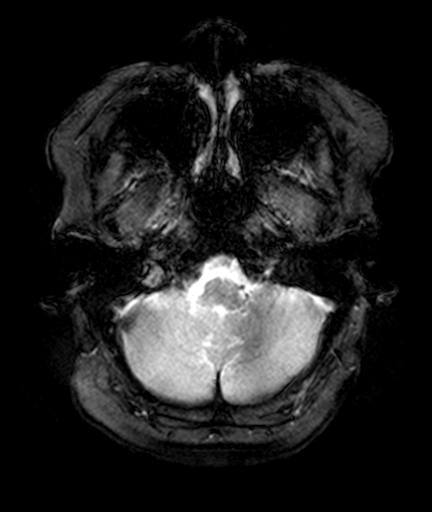
[im 10/25]
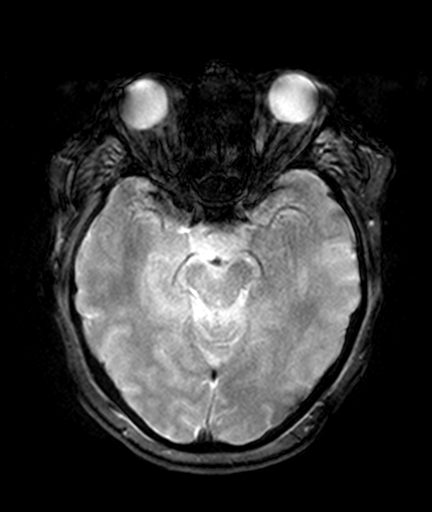
[im 15/25]
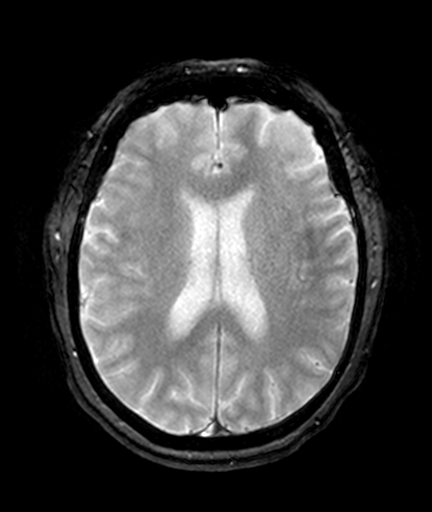
[im 20/25]
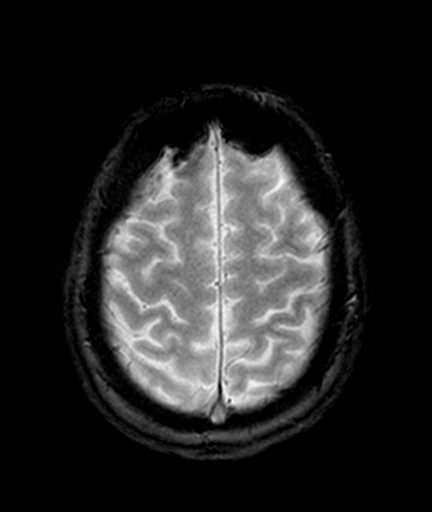
[im 25/25]
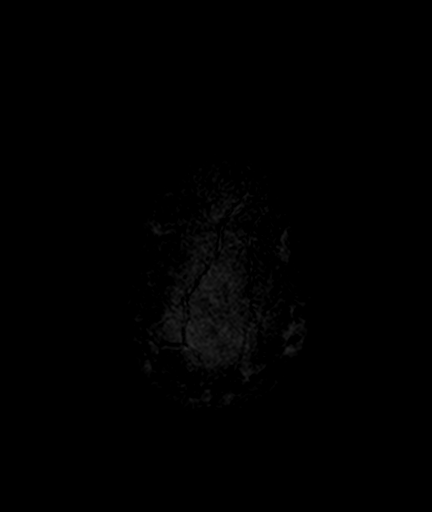

[FLAIR · axial · 5.0mm · 0.72mm/px · z∈[-62,+81]mm · 6 of 25 slices shown]
[im 1/25]
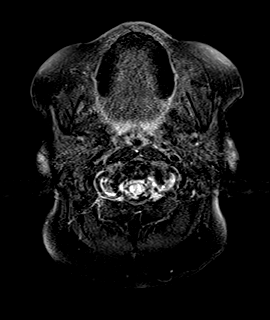
[im 5/25]
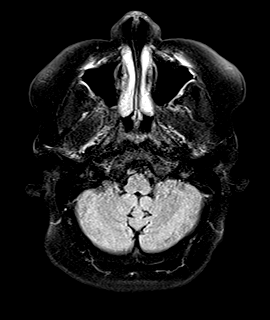
[im 10/25]
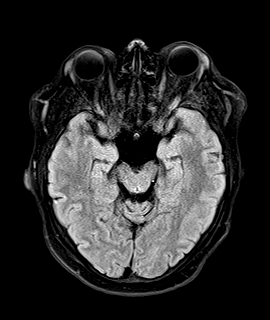
[im 15/25]
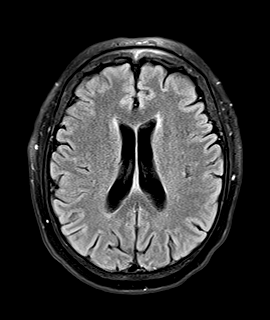
[im 20/25]
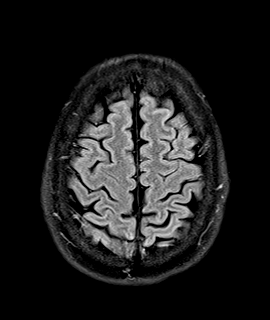
[im 25/25]
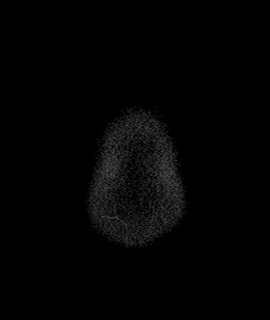

[T1 · axial · 5.0mm · 0.72mm/px · z∈[-63,+21]mm · 4 of 25 slices shown (2 of 2)]
[im 1/25]
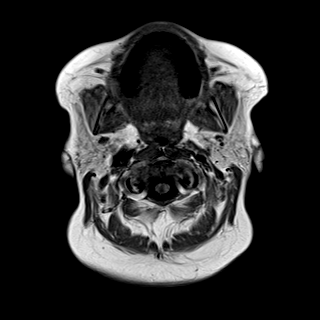
[im 5/25]
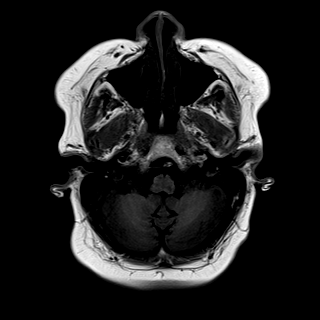
[im 10/25]
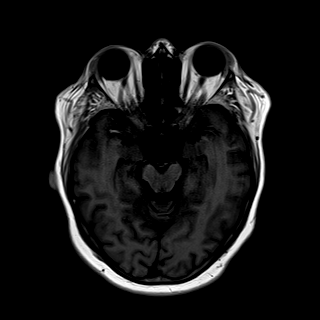
[im 15/25]
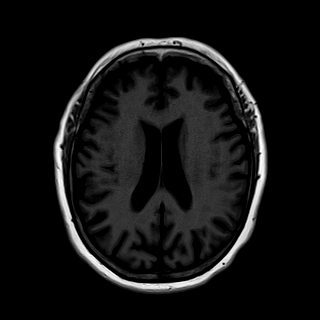

[27 of 48 positions shown; findings below may reference images not displayed]

FINDINGS: MRI BRAIN:
BRAIN PARENCHYMA:
New T2 Hyperintense Lesions: None
Overall disease burden: <10 lesions
T1 Hypointense Lesions: Absent  Unchanged from the prior exam.
Parenchymal Atrophy: None.
Other: No infarction on DWI. No hemorrhage.
ADDITIONAL FINDINGS:
Extra-axial Collection:  None
Ventricular System: No hydrocephalus.
Major Intracranial Flow Voids: Preserved
Osseous Structures:  Expected marrow signal.
Included Orbits: Bilateral lens replacements.
Paranasal Sinuses:  Predominantly clear
Tympanomastoid Cavities:  Unremarkable
MRI CERVICAL SPINE:
SPINAL CORD:
T2 Hyperintense Lesions: Questionable abnormal T2 cord signal along the right hemicord at C5 (series 3114, image 23).
Spinal Cord Volume: None.
ADDITIONAL FINDINGS:
Alignment: Straightening of the typical cervical lordosis.
Vertebral Bodies: No significant vertebral body height loss.
Marrow Signal: Expected marrow signal.
Intervertebral Discs: Multilevel disc dessication with loss of disc space height.
Paraspinal Soft Tissues: Unremarkable.
Individual Levels: Mild multilevel degenerative changes of the cervical spine without high-grade spinal canal stenosis or neural foraminal narrowing.
IMPRESSION: 1.  Unchanged brain white matter lesions consistent with demyelinating disease.
2.  Questionable abnormal T2 cord signal along the right hemicord at C5, which may be new compared to prior and represent a demyelinating disease. Correlate with patient symptoms.
Is the patient pregnant?
No

## 2023-07-03 IMAGING — MR MRI CERVICAL SPINE WITHOUT CONTRAST
8 series · 48 of 48 positions shown · non-contrast
Comparison: MRI 01/10/2023.

FINAL REPORT:
EXAM: MRI BRAIN WITHOUT CONTRAST, MRI CERVICAL SPINE WITHOUT CONTRAST
CLINICAL INDICATION:  Multiple sclerosis.
TECHNIQUE: Multiplanar, multisequence imaging of the brain and cervical spine without contrast was performed using the multiple sclerosis protocol.

[Series 3001: survey_mpr_sag · sagittal · 1.7mm · 1.67mm/px · 4 of 15 slices shown]
[im 1/15]
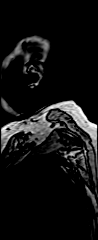
[im 5/15]
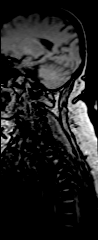
[im 10/15]
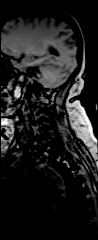
[im 15/15]
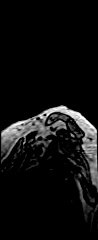

[Series 4001: survey_mpr_tra · axial · 1.7mm · 1.67mm/px · 1 of 7 slices shown]
[im 1/7]
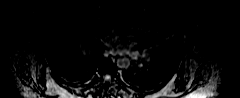

[Series 5001: T2 · sagittal · 3.0mm · 0.57mm/px · 4 of 15 slices shown (1 of 3)]
[im 1/15]
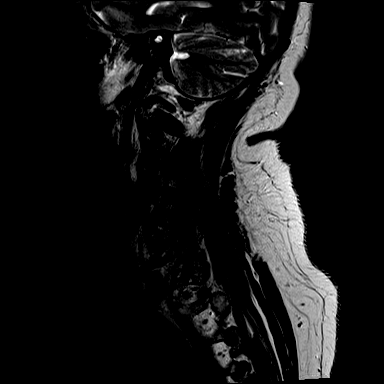
[im 5/15]
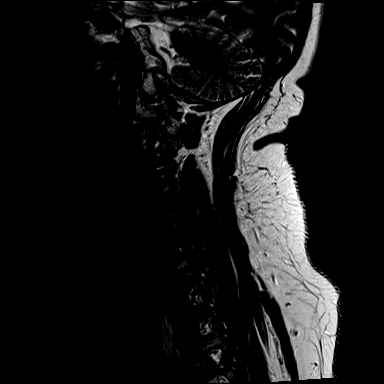
[im 10/15]
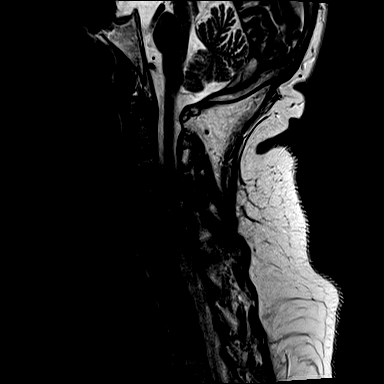
[im 15/15]
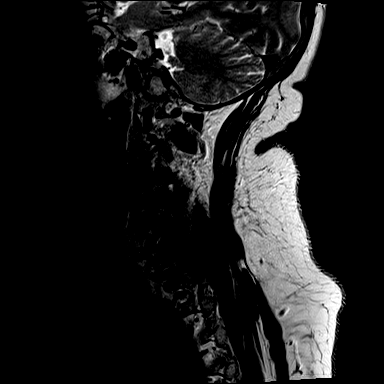

[Series 6001: T1 · sagittal · 3.0mm · 0.69mm/px · 5 of 15 slices shown]
[im 1/15]
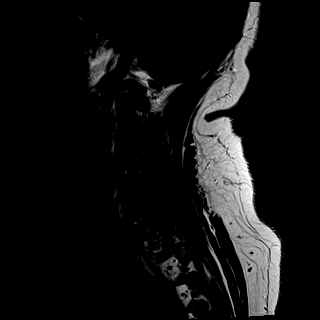
[im 4/15]
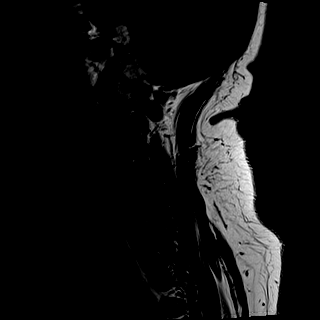
[im 8/15]
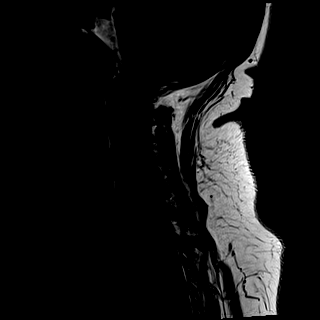
[im 11/15]
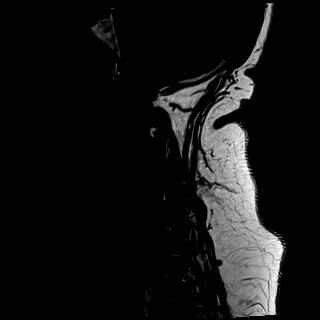
[im 15/15]
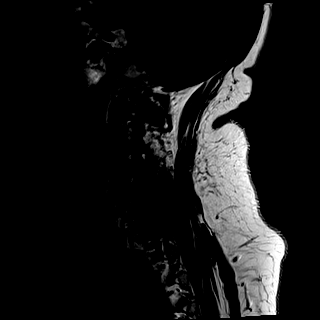

[Series 7001: STIR · sagittal · 3.0mm · 0.86mm/px · 5 of 15 slices shown]
[im 1/15]
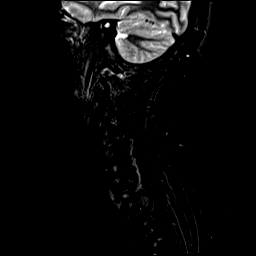
[im 4/15]
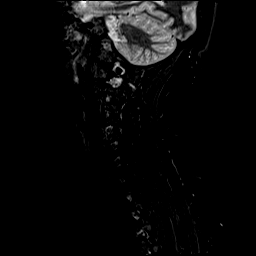
[im 8/15]
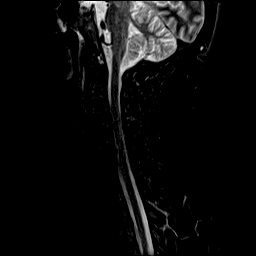
[im 11/15]
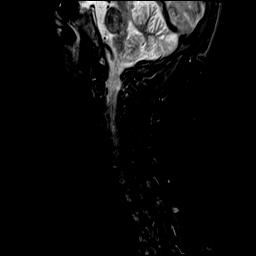
[im 15/15]
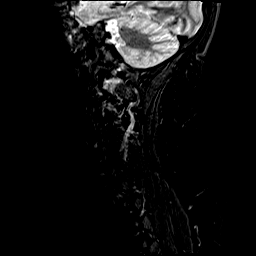

[Series 8001: GRE · axial · 3.0mm · 0.70mm/px · z∈[-196,-113]mm · 9 of 28 slices shown]
[im 1/28]
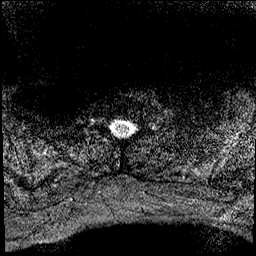
[im 4/28]
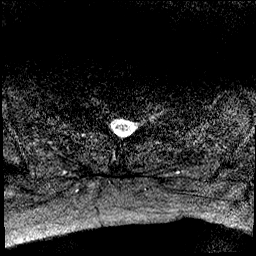
[im 7/28]
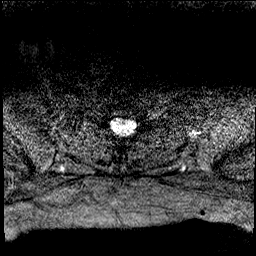
[im 11/28]
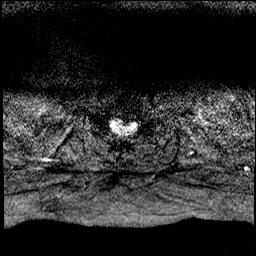
[im 14/28]
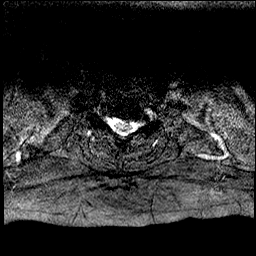
[im 17/28]
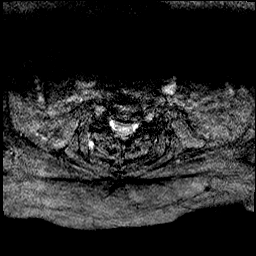
[im 21/28]
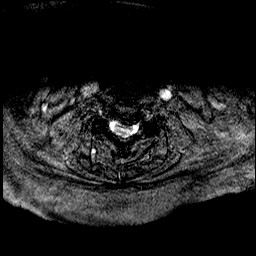
[im 24/28]
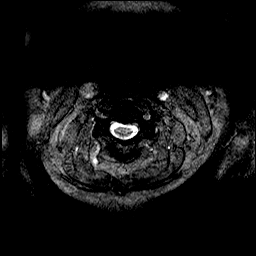
[im 28/28]
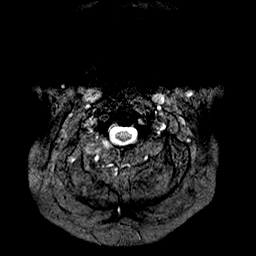

[Series 9001: T2 · axial · 2.0mm · 0.70mm/px · z∈[-199,-111]mm · 15 of 48 slices shown (2 of 3)]
[im 1/48]
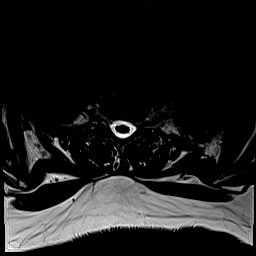
[im 4/48]
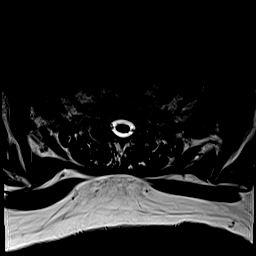
[im 7/48]
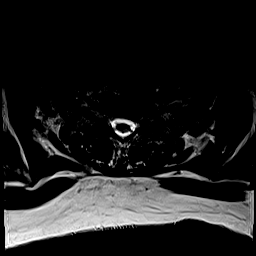
[im 11/48]
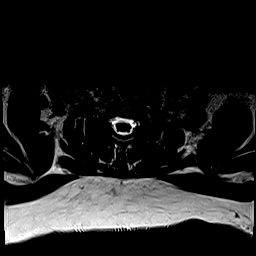
[im 14/48]
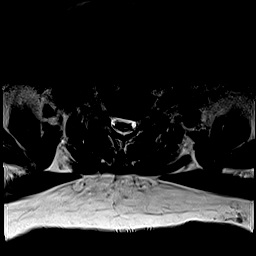
[im 17/48]
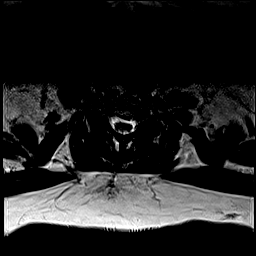
[im 21/48]
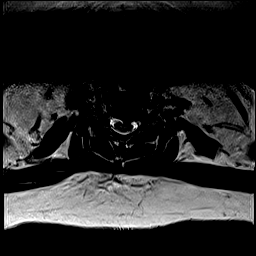
[im 24/48]
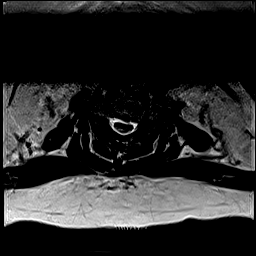
[im 27/48]
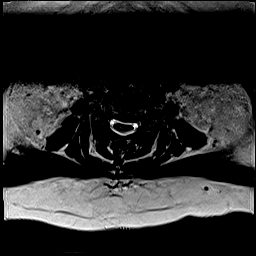
[im 31/48]
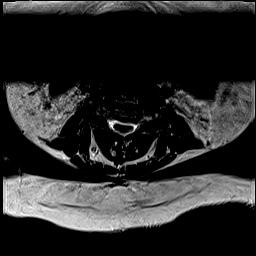
[im 34/48]
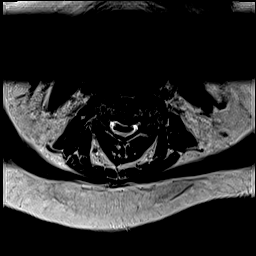
[im 37/48]
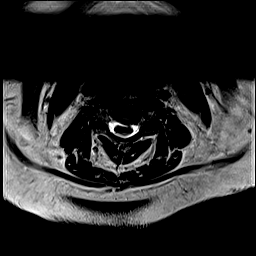
[im 41/48]
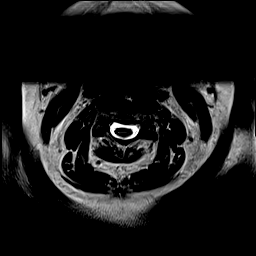
[im 44/48]
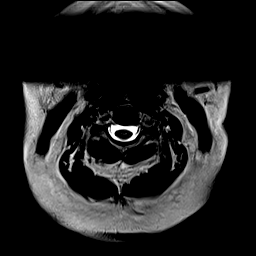
[im 48/48]
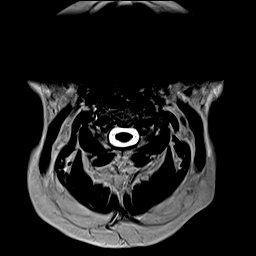

[T2 · sagittal · 3.0mm · 0.57mm/px · 5 of 15 slices shown (3 of 3)]
[im 1/15]
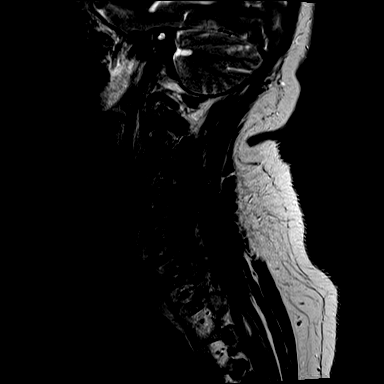
[im 4/15]
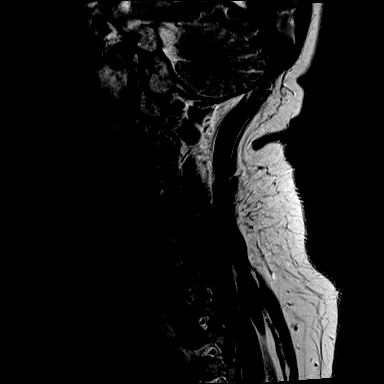
[im 8/15]
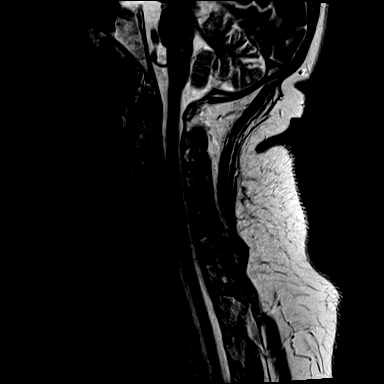
[im 11/15]
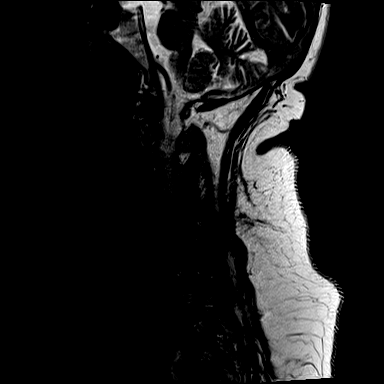
[im 15/15]
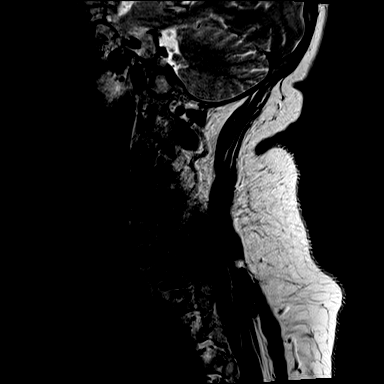

[48 of 48 positions shown; findings below may reference images not displayed]

FINDINGS: MRI BRAIN:
BRAIN PARENCHYMA:
New T2 Hyperintense Lesions: None
Overall disease burden: <10 lesions
T1 Hypointense Lesions: Absent  Unchanged from the prior exam.
Parenchymal Atrophy: None.
Other: No infarction on DWI. No hemorrhage.
ADDITIONAL FINDINGS:
Extra-axial Collection:  None
Ventricular System: No hydrocephalus.
Major Intracranial Flow Voids: Preserved
Osseous Structures:  Expected marrow signal.
Included Orbits: Bilateral lens replacements.
Paranasal Sinuses:  Predominantly clear
Tympanomastoid Cavities:  Unremarkable
MRI CERVICAL SPINE:
SPINAL CORD:
T2 Hyperintense Lesions: Questionable abnormal T2 cord signal along the right hemicord at C5 (series 3114, image 23).
Spinal Cord Volume: None.
ADDITIONAL FINDINGS:
Alignment: Straightening of the typical cervical lordosis.
Vertebral Bodies: No significant vertebral body height loss.
Marrow Signal: Expected marrow signal.
Intervertebral Discs: Multilevel disc dessication with loss of disc space height.
Paraspinal Soft Tissues: Unremarkable.
Individual Levels: Mild multilevel degenerative changes of the cervical spine without high-grade spinal canal stenosis or neural foraminal narrowing.
IMPRESSION: 1.  Unchanged brain white matter lesions consistent with demyelinating disease.
2.  Questionable abnormal T2 cord signal along the right hemicord at C5, which may be new compared to prior and represent a demyelinating disease. Correlate with patient symptoms.
Is the patient pregnant?
No

## 2024-01-25 IMAGING — MR MRI CERVICAL SPINE WITH AND WITHOUT CONTRAST
11 series · 44 of 48 positions shown · IV contrast (Contrast agent)
Comparison: MRI brain dated 07/03/2023. MRI cervical spine dated 07/03/2023. MRI
lumbar spine dated 01/10/2023.

FINAL REPORT:
MRI BRAIN WITH AND WITHOUT CONTRAST
Multiple sclerosis, unspecified
EXAM: MRI BRAIN WITH AND WITHOUT CONTRAST, MRI CERVICAL SPINE WITH AND WITHOUT
CONTRAST, MRI THORACIC SPINE WITH AND  WITHOUT CONTRAST
CLINICAL INDICATION:  Multiple sclerosis, unspecified.
TECHNIQUE: Multiplanar, multisequence imaging of the brain, cervical spine, and
thoracic spine without and with contrast was performed using the multiple
sclerosis protocol. Intravenous contrast material was administered for the
examination.

[Series 1001: survey c · coronal · 1.7mm · 1.67mm/px · 10 of 96 slices shown]
[im 1/96]
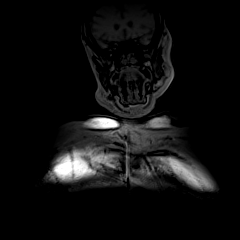
[im 8/96]
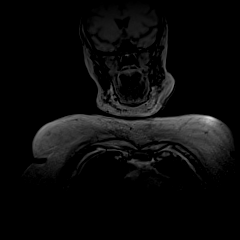
[im 15/96]
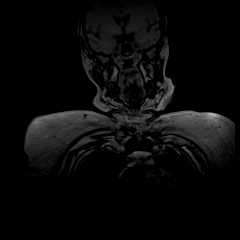
[im 22/96]
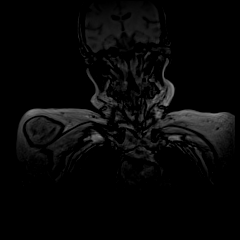
[im 30/96]
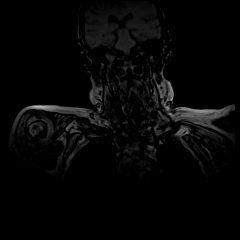
[im 44/96]
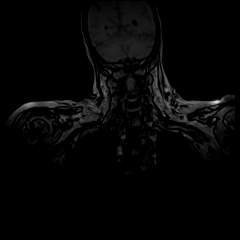
[im 52/96]
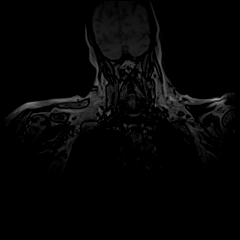
[im 66/96]
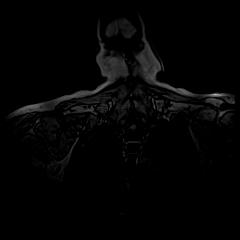
[im 81/96]
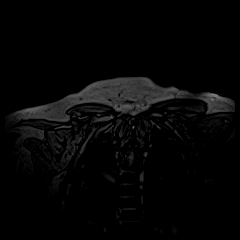
[im 96/96]
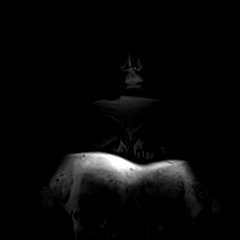

[Series 3001: survey c_mpr_sag · sagittal · 1.7mm · 1.67mm/px · 2 of 15 slices shown]
[im 1/15]
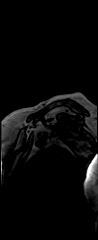
[im 15/15]
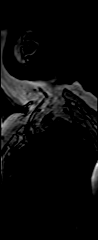

[Series 4001: survey c_mpr_(person_name) · axial · 1.7mm · 1.67mm/px · 1 of 7 slices shown]
[im 1/7]
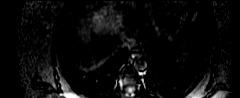

[Series 5001: T2 · sagittal · 3.0mm · 0.69mm/px · 3 of 19 slices shown (1 of 2)]
[im 1/19]
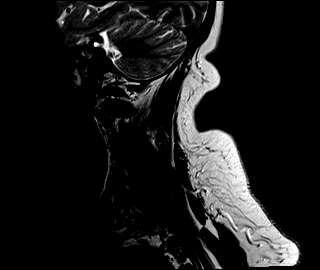
[im 10/19]
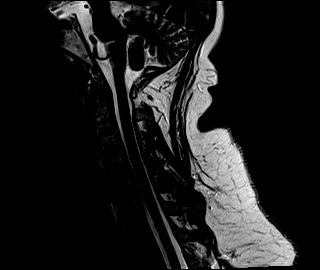
[im 19/19]
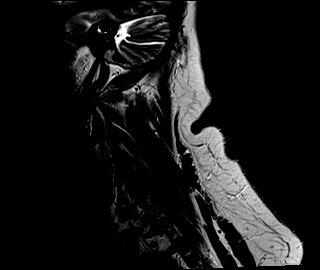

[Series 6001: T1 · sagittal · 3.0mm · 0.62mm/px · 2 of 15 slices shown (1 of 2)]
[im 1/15]
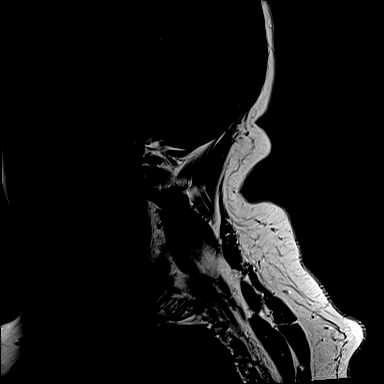
[im 15/15]
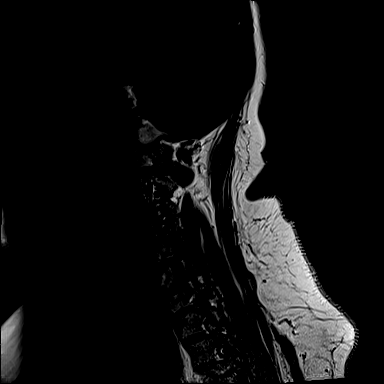

[Series 7001: STIR · sagittal · 3.0mm · 0.43mm/px · 3 of 19 slices shown]
[im 1/19]
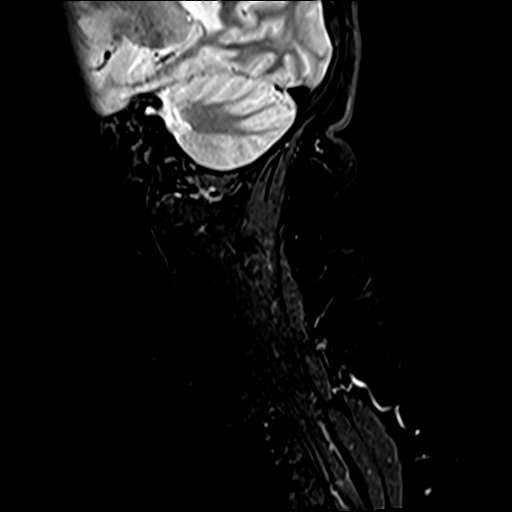
[im 10/19]
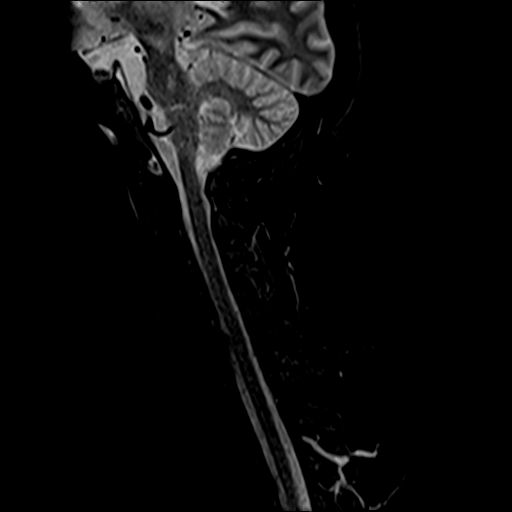
[im 19/19]
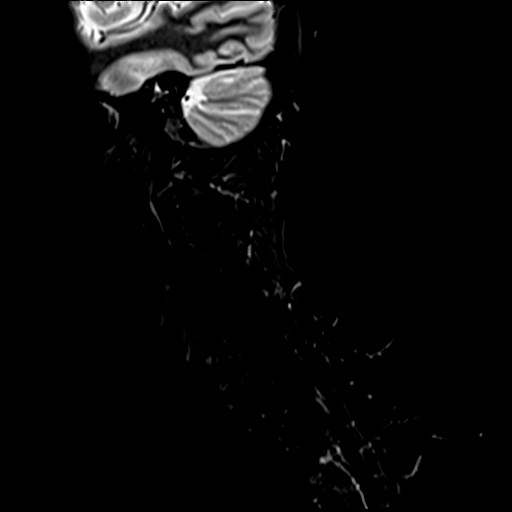

[Series 8001: GRE · axial · 3.0mm · 0.94mm/px · z∈[-228,-147]mm · 4 of 28 slices shown]
[im 1/28]
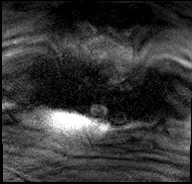
[im 10/28]
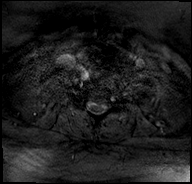
[im 19/28]
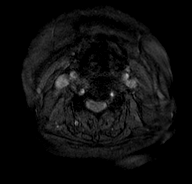
[im 28/28]
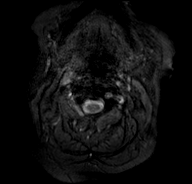

[Series 9001: T2 · axial · 3.0mm · 0.70mm/px · z∈[-234,-128]mm · 6 of 40 slices shown (2 of 2)]
[im 1/40]
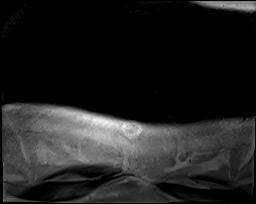
[im 8/40]
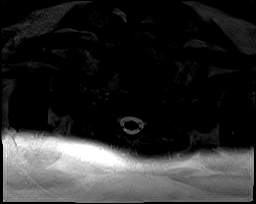
[im 16/40]
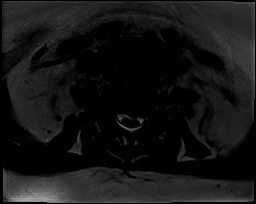
[im 24/40]
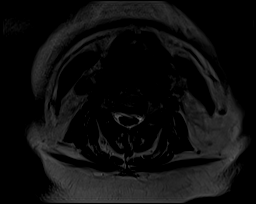
[im 32/40]
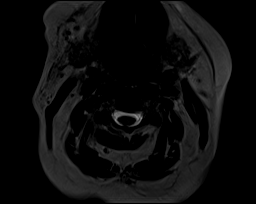
[im 40/40]
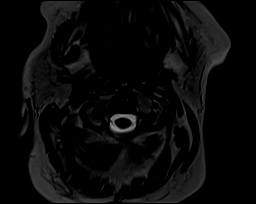

[T1 · axial · non-contrast · 3.0mm · 0.47mm/px · z∈[-243,-135]mm · 5 of 37 slices shown (2 of 2)]
[im 1/37]
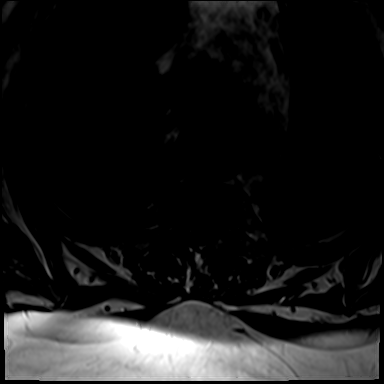
[im 10/37]
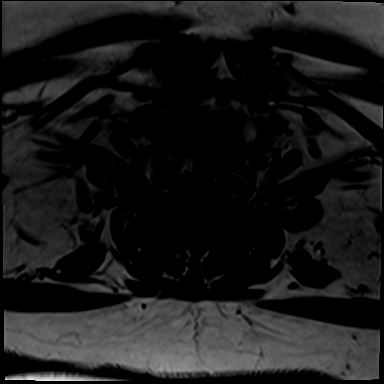
[im 19/37]
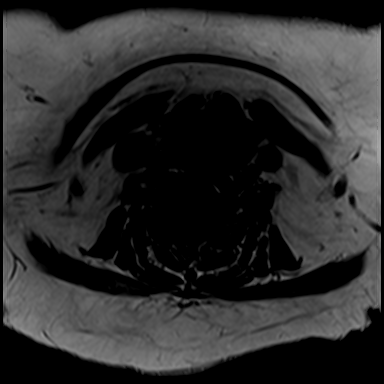
[im 28/37]
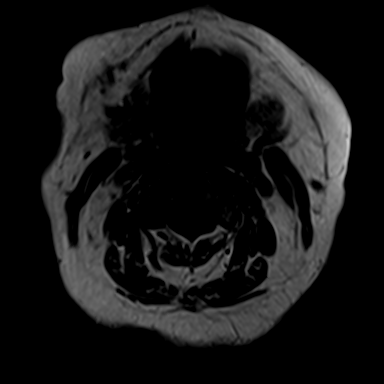
[im 37/37]
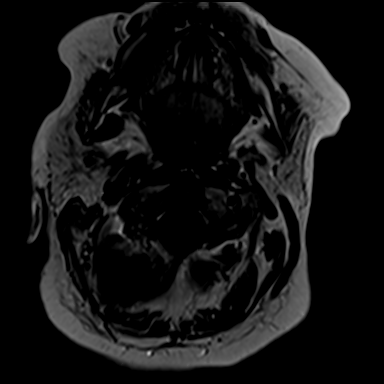

[T1 fat-sat post-contrast · sagittal · 3.0mm · 0.57mm/px · 3 of 19 slices shown (1 of 2)]
[im 1/19]
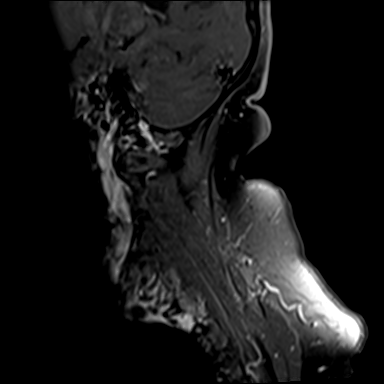
[im 10/19]
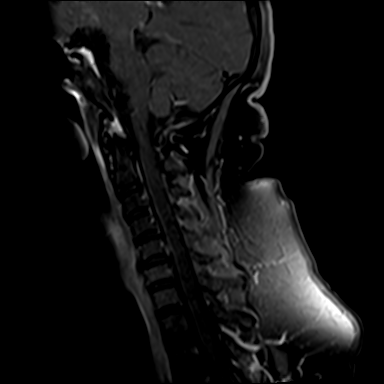
[im 19/19]
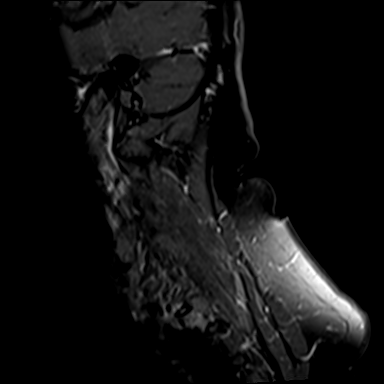

[T1 fat-sat post-contrast · axial · 3.0mm · 0.47mm/px · z∈[-243,-135]mm · 5 of 37 slices shown (2 of 2)]
[im 1/37]
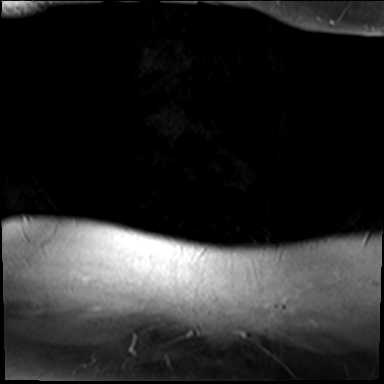
[im 10/37]
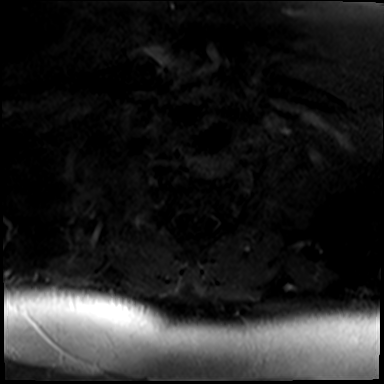
[im 19/37]
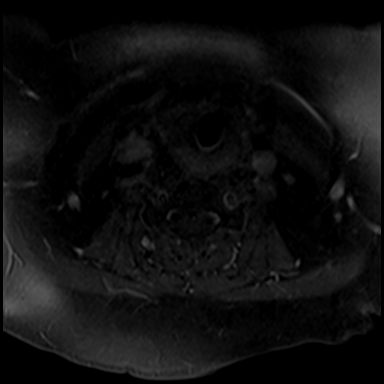
[im 28/37]
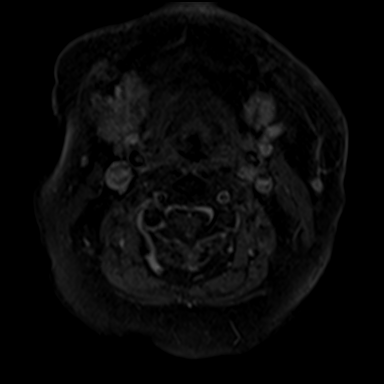
[im 37/37]
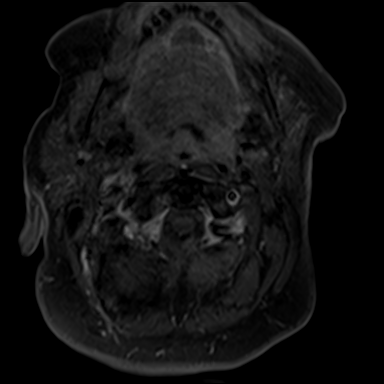

[44 of 48 positions shown; findings below may reference images not displayed]

FINDINGS: Evaluation is partially degraded by motion-related artifact.
MRI BRAIN:
BRAIN PARENCHYMA:
New T2 Hyperintense Lesions: None
Overall disease burden: <10 lesions; one lesion is noted abutting the ependymal
surface (series 70227, image 12).
Enhancing Lesions: None. Nonspecific linear enhancement about the left frontal
lobe which may be vascular in etiology (series 09000, images 68-69).
T1 Hypointense Lesions: <5  Unchanged from the prior exam.
Parenchymal Atrophy: None.
Other: No infarction on DWI. No hemorrhage.
ADDITIONAL FINDINGS:
Extra-axial Collection:  None
Ventricular System: No hydrocephalus.
Major Intracranial Flow Voids: Preserved
Osseous Structures:  Expected marrow signal.
Included Orbits: Bilateral lens replacement.
Paranasal Sinuses:  Predominantly clear
Tympanomastoid Cavities:  Unremarkable
MRI CERVICAL SPINE:
SPINAL CORD:
T2 Hyperintense Lesions: None.
Enhancing Lesions:  None.
Spinal Cord Volume: Normal.
ADDITIONAL FINDINGS:
Alignment: Straightening of the cervical lordosis.
Vertebral Bodies: Normal in height.
Marrow Signal: Expected.
Intervertebral Discs: Multilevel disc dessication without loss of disc space
height.
Paraspinal Soft Tissues: Partial nonvisualization of the right vertebral artery
flow-void, unchanged
Individual Levels: Multilevel degenerative changes with similar disc extrusion
at C5-C6 resulting in partial effacement of the ventral CSF and cord contact. No
high-grade spinal canal stenosis or neural foraminal narrowing.
THORACIC SPINE:
SPINAL CORD:
T2 Hyperintense Lesions: Apparent foci of T2 hyperintensity without definite
correlate on sagittal images.
Enhancing Lesions:  None.
Spinal Cord Volume: Normal.
ADDITIONAL FINDINGS:
Alignment: Normal.
Vertebral Bodies: Normal in height.
Marrow Signal: Expected.
Intervertebral Discs: Multilevel disc dessication without loss of disc space
height
Paraspinal Soft Tissues: Indeterminate left adrenal nodule measuring
approximately 1.3 cm, unchanged from examination dated 01/10/2023.
Individual Levels: Mild multilevel degenerative changes of the cervical spine
without spinal canal stenosis or foraminal narrowing.
IMPRESSION: 1.  Unchanged brain T2 hyperintense lesions which could be in the context of
reported demyelinating disease.
2.  No abnormal lesion involving the cervical spinal cord.
3.  Apparent T2 hyperintense foci involving the thoracic spinal cord are without
correlate on sagittal images and presumed artifactual, attention on follow-up.
4.  No abnormal enhancement to suggest active disease.
AMFrom Workstation ID: [email or web address]
Is the patient pregnant?
Unknown

## 2024-01-25 IMAGING — MR MRI BRAIN WITH AND WITHOUT CONTRAST
17 series · 48 of 48 positions shown · IV contrast (agent unspecified)
Comparison: MRI brain dated 07/03/2023. MRI cervical spine dated 07/03/2023. MRI
lumbar spine dated 01/10/2023.

FINAL REPORT:
MRI BRAIN WITH AND WITHOUT CONTRAST
Multiple sclerosis, unspecified
EXAM: MRI BRAIN WITH AND WITHOUT CONTRAST, MRI CERVICAL SPINE WITH AND WITHOUT
CONTRAST, MRI THORACIC SPINE WITH AND  WITHOUT CONTRAST
CLINICAL INDICATION:  Multiple sclerosis, unspecified.
TECHNIQUE: Multiplanar, multisequence imaging of the brain, cervical spine, and
thoracic spine without and with contrast was performed using the multiple
sclerosis protocol. Intravenous contrast material was administered for the
examination.

[Series 1001: survey br · sagittal · 1.6mm · 1.62mm/px · 8 of 128 slices shown]
[im 1/128]
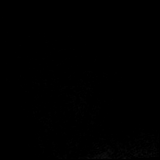
[im 19/128]
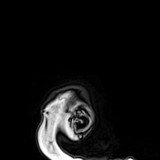
[im 37/128]
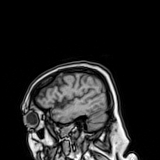
[im 55/128]
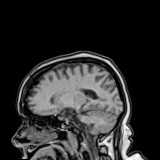
[im 73/128]
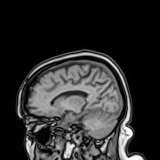
[im 91/128]
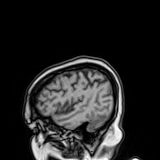
[im 109/128]
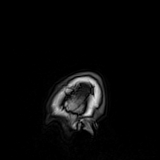
[im 128/128]
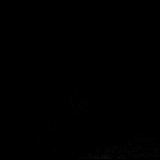

[Series 2001: survey br_mpr_sag · sagittal · 1.6mm · 1.60mm/px · 1 of 5 slices shown]
[im 1/5]
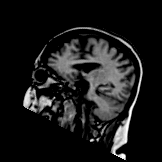

[Series 3001: survey br_mpr_cor · coronal · 1.6mm · 1.60mm/px · 1 of 3 slices shown]
[im 1/3]
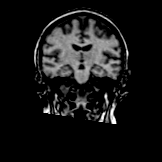

[Series 4001: survey br_mpr_(person_name) · axial · 1.6mm · 1.60mm/px · 1 of 3 slices shown]
[im 1/3]
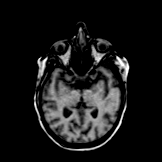

[Series 5001: T1 · sagittal · 5.0mm · 0.78mm/px · 1 of 25 slices shown (1 of 2)]
[im 1/25]
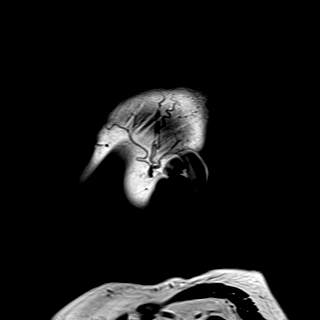

[Series 6002: ax dwi_tracew · axial · 5.0mm · 0.60mm/px · 1 of 26 slices shown]
[im 1/26]
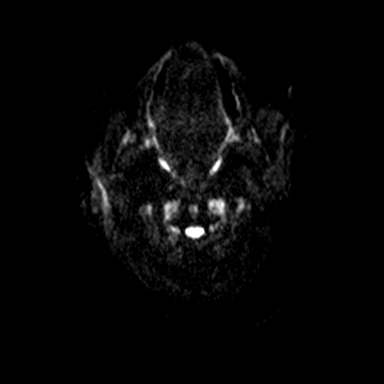

[Series 7001: ax dwi_adc · axial · 5.0mm · 0.60mm/px · 1 of 26 slices shown]
[im 1/26]
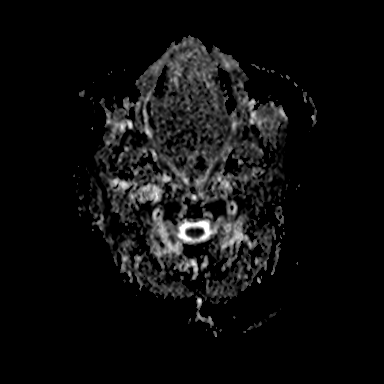

[Series 8001: ax dwi_exp · axial · 5.0mm · 0.60mm/px · 1 of 26 slices shown]
[im 1/26]
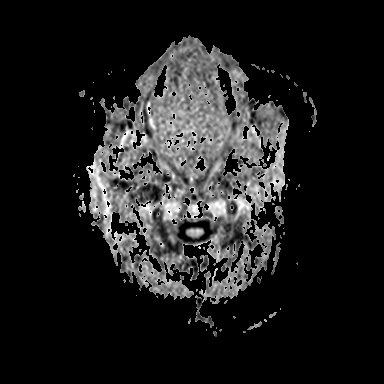

[Series 9001: FLAIR · axial · 5.0mm · 0.72mm/px · 1 of 26 slices shown (1 of 2)]
[im 1/26]
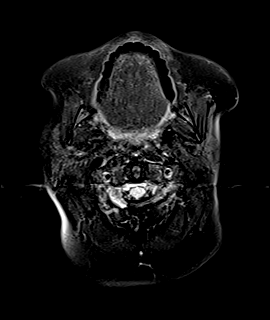

[T2 fat-sat · axial · 5.0mm · 0.51mm/px · 1 of 26 slices shown]
[im 1/26]
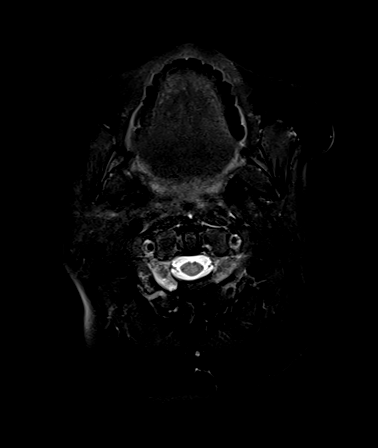

[T1 · axial · 5.0mm · 0.72mm/px · 1 of 26 slices shown (2 of 2)]
[im 1/26]
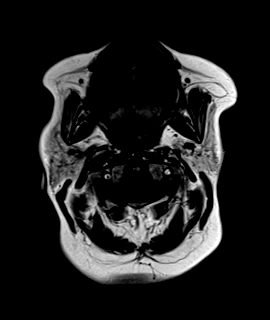

[GRE · axial · 5.0mm · 0.45mm/px · 1 of 26 slices shown]
[im 1/26]
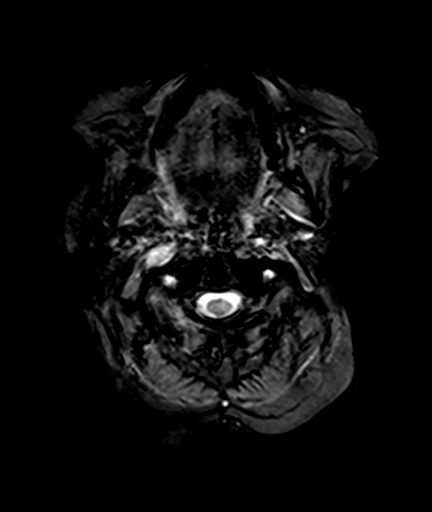

[FLAIR · sagittal · 5.0mm · 0.90mm/px · 1 of 25 slices shown (2 of 2)]
[im 1/25]
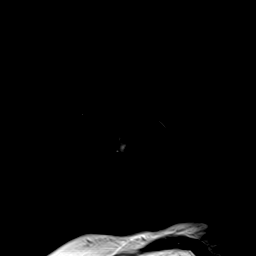

[T1 post-contrast · axial · 5.0mm · 0.72mm/px · 1 of 26 slices shown]
[im 1/26]
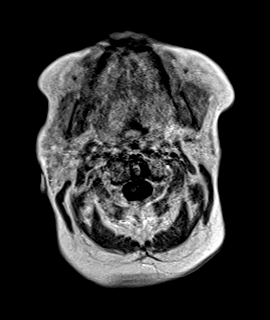

[t1_mprage_(person_name)_(person_name)2_iso_1.0 post · axial · 1.0mm · 0.97mm/px · z∈[-115,+42]mm · 9 of 160 slices shown]
[im 1/160]
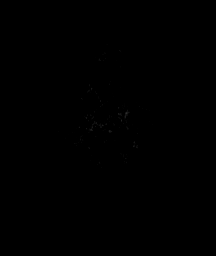
[im 20/160]
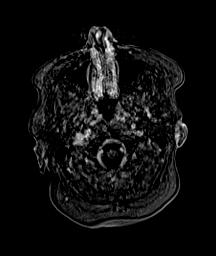
[im 40/160]
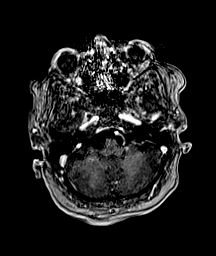
[im 60/160]
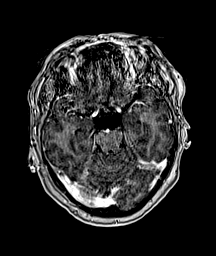
[im 80/160]
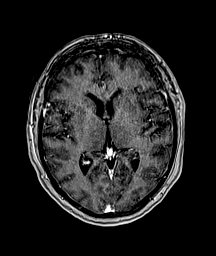
[im 100/160]
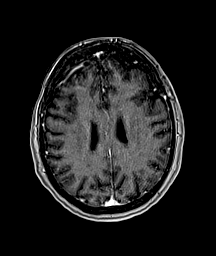
[im 120/160]
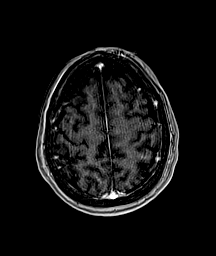
[im 140/160]
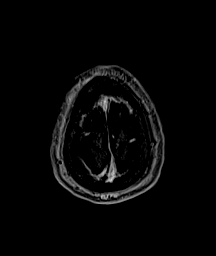
[im 160/160]
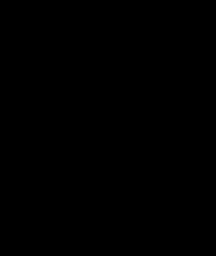

[t1_mprage_(person_name)_(person_name)2_iso_1.0 post_mpr_sag · sagittal · 1.0mm · 0.97mm/px · 8 of 150 slices shown]
[im 1/150]
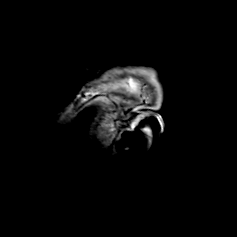
[im 22/150]
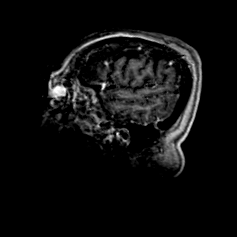
[im 43/150]
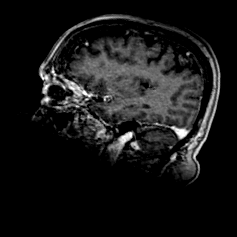
[im 64/150]
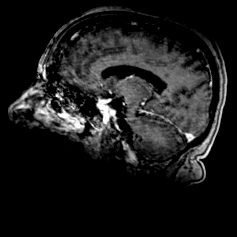
[im 86/150]
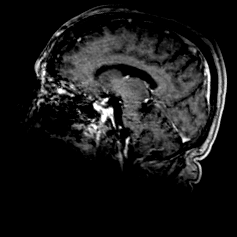
[im 107/150]
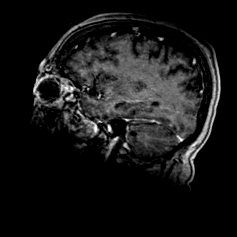
[im 128/150]
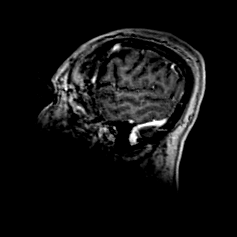
[im 150/150]
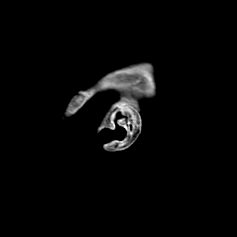

[t1_mprage_(person_name)_(person_name)2_iso_1.0 post_mpr_cor · coronal · 1.0mm · 0.97mm/px · 10 of 195 slices shown]
[im 1/195]
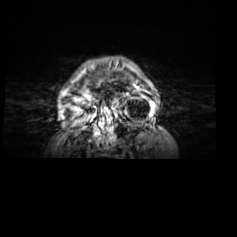
[im 22/195]
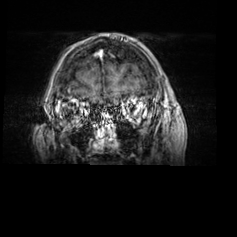
[im 44/195]
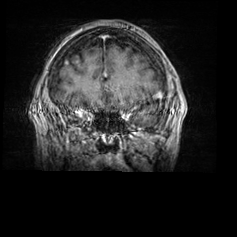
[im 65/195]
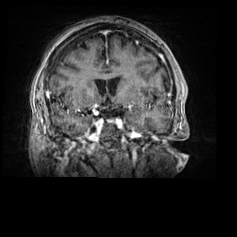
[im 87/195]
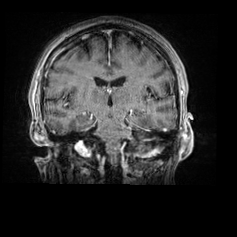
[im 108/195]
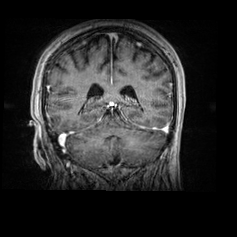
[im 130/195]
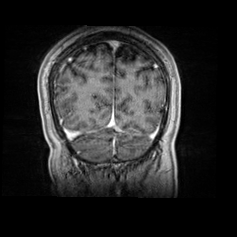
[im 151/195]
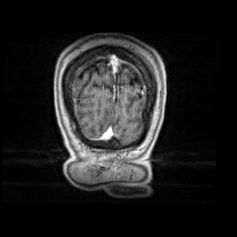
[im 173/195]
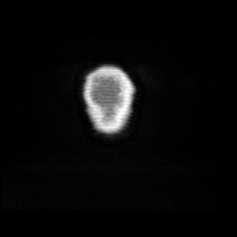
[im 195/195]
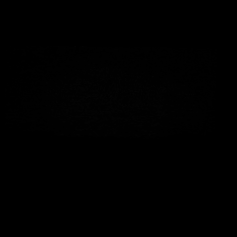

[48 of 48 positions shown; findings below may reference images not displayed]

FINDINGS: Evaluation is partially degraded by motion-related artifact.
MRI BRAIN:
BRAIN PARENCHYMA:
New T2 Hyperintense Lesions: None
Overall disease burden: <10 lesions; one lesion is noted abutting the ependymal
surface (series 70227, image 12).
Enhancing Lesions: None. Nonspecific linear enhancement about the left frontal
lobe which may be vascular in etiology (series 09000, images 68-69).
T1 Hypointense Lesions: <5  Unchanged from the prior exam.
Parenchymal Atrophy: None.
Other: No infarction on DWI. No hemorrhage.
ADDITIONAL FINDINGS:
Extra-axial Collection:  None
Ventricular System: No hydrocephalus.
Major Intracranial Flow Voids: Preserved
Osseous Structures:  Expected marrow signal.
Included Orbits: Bilateral lens replacement.
Paranasal Sinuses:  Predominantly clear
Tympanomastoid Cavities:  Unremarkable
MRI CERVICAL SPINE:
SPINAL CORD:
T2 Hyperintense Lesions: None.
Enhancing Lesions:  None.
Spinal Cord Volume: Normal.
ADDITIONAL FINDINGS:
Alignment: Straightening of the cervical lordosis.
Vertebral Bodies: Normal in height.
Marrow Signal: Expected.
Intervertebral Discs: Multilevel disc dessication without loss of disc space
height.
Paraspinal Soft Tissues: Partial nonvisualization of the right vertebral artery
flow-void, unchanged
Individual Levels: Multilevel degenerative changes with similar disc extrusion
at C5-C6 resulting in partial effacement of the ventral CSF and cord contact. No
high-grade spinal canal stenosis or neural foraminal narrowing.
THORACIC SPINE:
SPINAL CORD:
T2 Hyperintense Lesions: Apparent foci of T2 hyperintensity without definite
correlate on sagittal images.
Enhancing Lesions:  None.
Spinal Cord Volume: Normal.
ADDITIONAL FINDINGS:
Alignment: Normal.
Vertebral Bodies: Normal in height.
Marrow Signal: Expected.
Intervertebral Discs: Multilevel disc dessication without loss of disc space
height
Paraspinal Soft Tissues: Indeterminate left adrenal nodule measuring
approximately 1.3 cm, unchanged from examination dated 01/10/2023.
Individual Levels: Mild multilevel degenerative changes of the cervical spine
without spinal canal stenosis or foraminal narrowing.
IMPRESSION: 1.  Unchanged brain T2 hyperintense lesions which could be in the context of
reported demyelinating disease.
2.  No abnormal lesion involving the cervical spinal cord.
3.  Apparent T2 hyperintense foci involving the thoracic spinal cord are without
correlate on sagittal images and presumed artifactual, attention on follow-up.
4.  No abnormal enhancement to suggest active disease.
AMFrom Workstation ID: [email or web address]
Is the patient pregnant?
Unknown

## 2024-01-25 IMAGING — MR MRI THORACIC SPINE WITH AND WITHOUT CONTRAST
14 of 16 series · 35 of 48 positions shown · IV contrast (agent unspecified)
Comparison: MRI brain dated 07/03/2023. MRI cervical spine dated 07/03/2023. MRI
lumbar spine dated 01/10/2023.

FINAL REPORT:
MRI BRAIN WITH AND WITHOUT CONTRAST
Multiple sclerosis, unspecified
EXAM: MRI BRAIN WITH AND WITHOUT CONTRAST, MRI CERVICAL SPINE WITH AND WITHOUT
CONTRAST, MRI THORACIC SPINE WITH AND  WITHOUT CONTRAST
CLINICAL INDICATION:  Multiple sclerosis, unspecified.
TECHNIQUE: Multiplanar, multisequence imaging of the brain, cervical spine, and
thoracic spine without and with contrast was performed using the multiple
sclerosis protocol. Intravenous contrast material was administered for the
examination.

[survey · coronal · 1.7mm · 1.67mm/px · 13 of 128 slices shown]
[im 1/128]
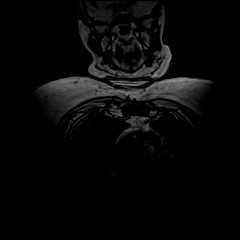
[im 11/128]
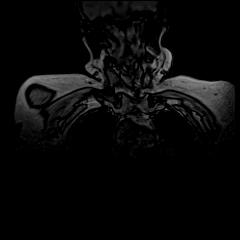
[im 22/128]
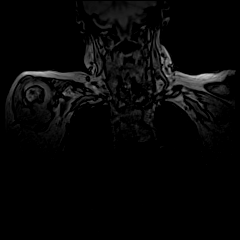
[im 32/128]
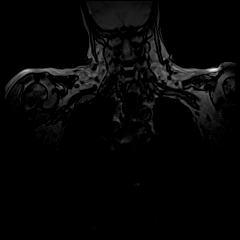
[im 43/128]
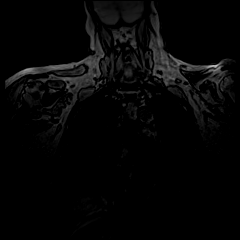
[im 53/128]
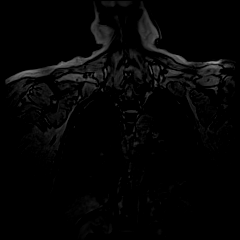
[im 64/128]
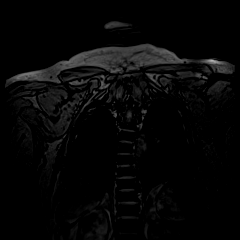
[im 75/128]
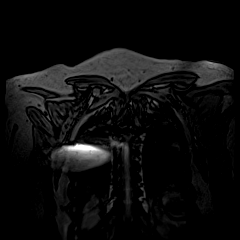
[im 85/128]
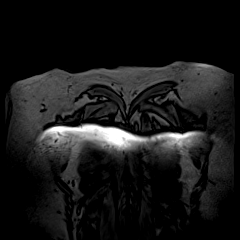
[im 96/128]
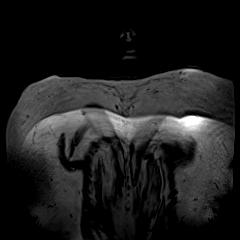
[im 106/128]
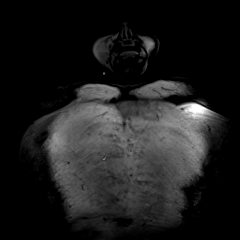
[im 117/128]
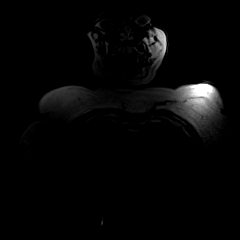
[im 128/128]
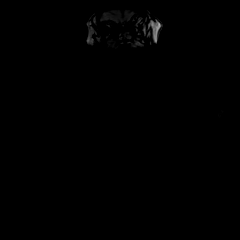

[survey_mpr_sag · sagittal · 1.7mm · 1.67mm/px · 1 of 15 slices shown]
[im 1/15]
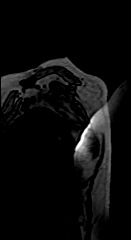

[survey_mpr_(person_name) · axial · 1.7mm · 1.67mm/px · 1 of 7 slices shown]
[im 1/7]
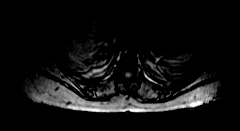

[T2 · sagittal · 4.0mm · 0.76mm/px · 1 of 15 slices shown (1 of 3)]
[im 1/15]
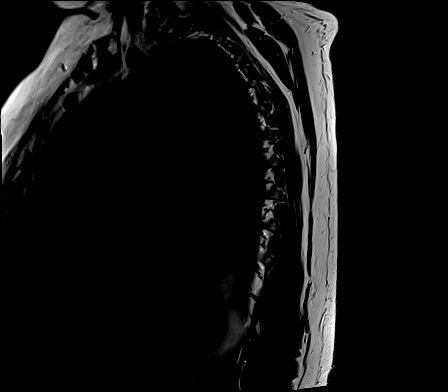

[T1 · sagittal · 4.0mm · 1.06mm/px · 1 of 15 slices shown (1 of 3)]
[im 1/15]
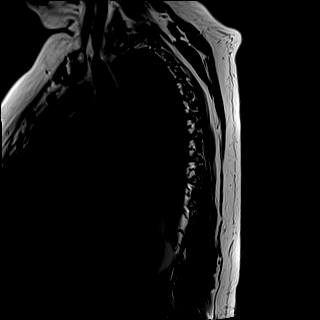

[STIR · sagittal · 4.0mm · 0.89mm/px · 1 of 15 slices shown]
[im 1/15]
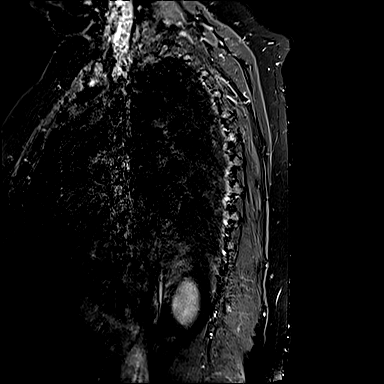

[T2 · axial · 4.0mm · 0.78mm/px · z∈[-455,-284]mm · 4 of 40 slices shown (2 of 3)]
[im 1/40]
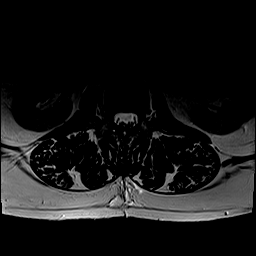
[im 14/40]
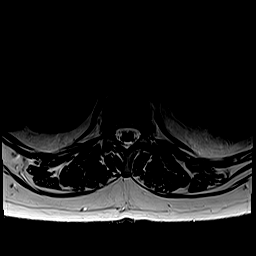
[im 27/40]
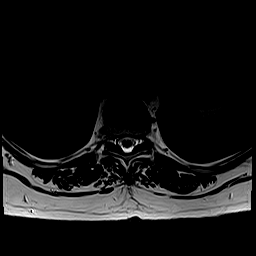
[im 40/40]
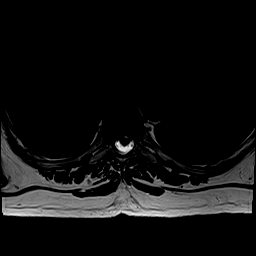

[T2 · axial · 4.0mm · 0.78mm/px · z∈[-320,-196]mm · 3 of 31 slices shown (3 of 3)]
[im 1/31]
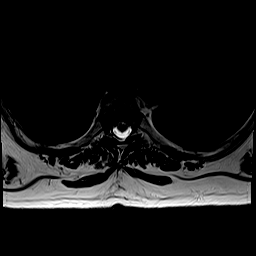
[im 16/31]
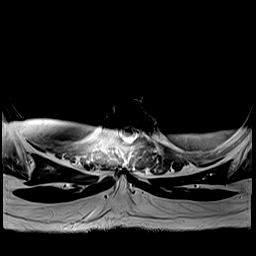
[im 31/31]
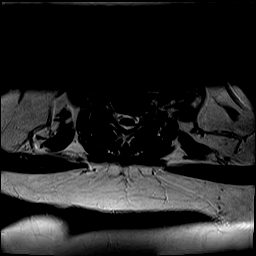

[T1 · axial · non-contrast · 7.0mm · 0.39mm/px · z∈[-318,-187]mm · 2 of 20 slices shown (2 of 3)]
[im 1/20]
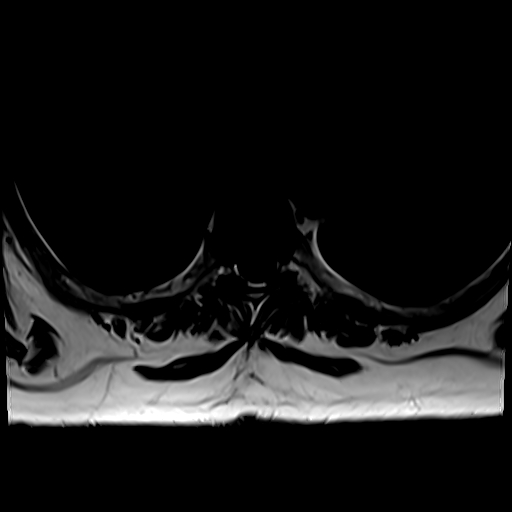
[im 20/20]
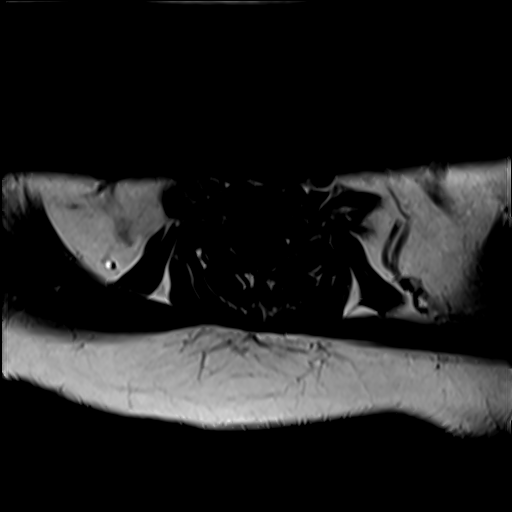

[T1 · axial · non-contrast · 7.0mm · 0.39mm/px · z∈[-446,-269]mm · 2 of 25 slices shown (3 of 3)]
[im 1/25]
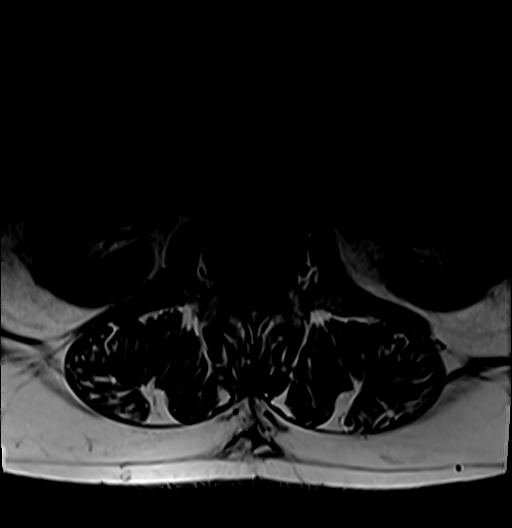
[im 25/25]
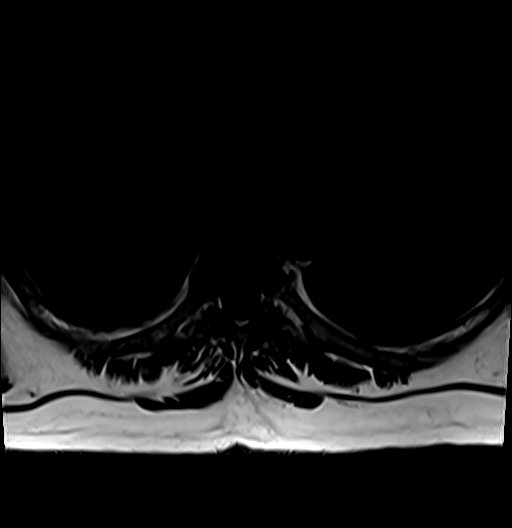

[T1 post-contrast · axial · 7.0mm · 0.39mm/px · z∈[-318,-187]mm · 2 of 20 slices shown (1 of 3)]
[im 1/20]
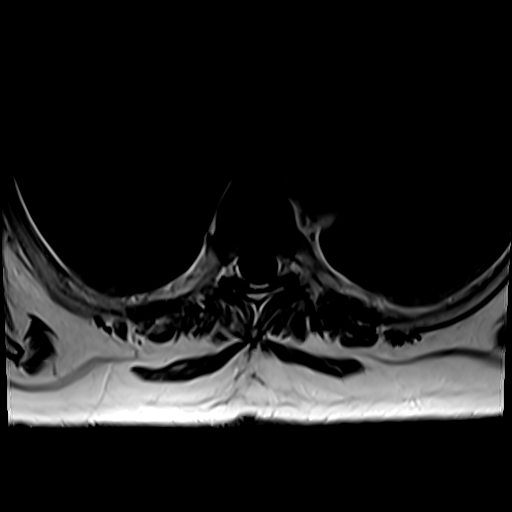
[im 20/20]
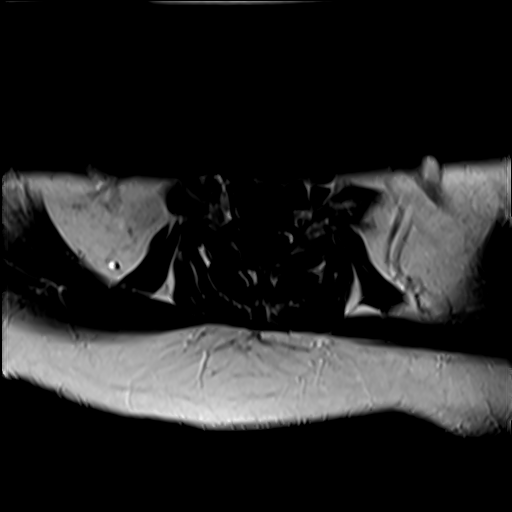

[T1 post-contrast · axial · 7.0mm · 0.39mm/px · z∈[-446,-269]mm · 2 of 25 slices shown (2 of 3)]
[im 1/25]
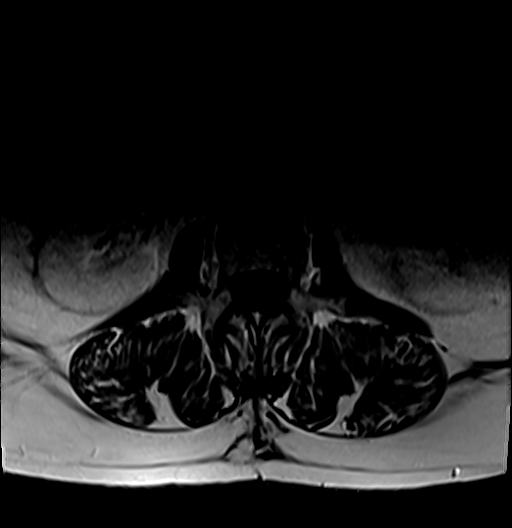
[im 25/25]
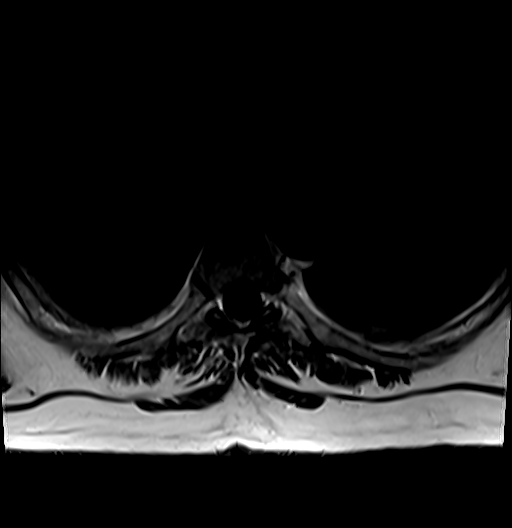

[T1 post-contrast · sagittal · 4.0mm · 1.06mm/px · 1 of 15 slices shown (3 of 3)]
[im 1/15]
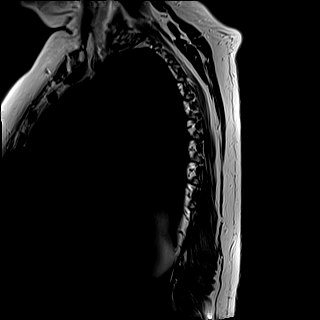

[T1 fat-sat post-contrast · sagittal · 4.0mm · 1.06mm/px · 1 of 15 slices shown]
[im 1/15]
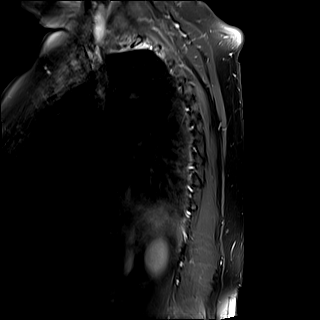

[35 of 48 positions shown; findings below may reference images not displayed]

FINDINGS: Evaluation is partially degraded by motion-related artifact.
MRI BRAIN:
BRAIN PARENCHYMA:
New T2 Hyperintense Lesions: None
Overall disease burden: <10 lesions; one lesion is noted abutting the ependymal
surface (series 70227, image 12).
Enhancing Lesions: None. Nonspecific linear enhancement about the left frontal
lobe which may be vascular in etiology (series 09000, images 68-69).
T1 Hypointense Lesions: <5  Unchanged from the prior exam.
Parenchymal Atrophy: None.
Other: No infarction on DWI. No hemorrhage.
ADDITIONAL FINDINGS:
Extra-axial Collection:  None
Ventricular System: No hydrocephalus.
Major Intracranial Flow Voids: Preserved
Osseous Structures:  Expected marrow signal.
Included Orbits: Bilateral lens replacement.
Paranasal Sinuses:  Predominantly clear
Tympanomastoid Cavities:  Unremarkable
MRI CERVICAL SPINE:
SPINAL CORD:
T2 Hyperintense Lesions: None.
Enhancing Lesions:  None.
Spinal Cord Volume: Normal.
ADDITIONAL FINDINGS:
Alignment: Straightening of the cervical lordosis.
Vertebral Bodies: Normal in height.
Marrow Signal: Expected.
Intervertebral Discs: Multilevel disc dessication without loss of disc space
height.
Paraspinal Soft Tissues: Partial nonvisualization of the right vertebral artery
flow-void, unchanged
Individual Levels: Multilevel degenerative changes with similar disc extrusion
at C5-C6 resulting in partial effacement of the ventral CSF and cord contact. No
high-grade spinal canal stenosis or neural foraminal narrowing.
THORACIC SPINE:
SPINAL CORD:
T2 Hyperintense Lesions: Apparent foci of T2 hyperintensity without definite
correlate on sagittal images.
Enhancing Lesions:  None.
Spinal Cord Volume: Normal.
ADDITIONAL FINDINGS:
Alignment: Normal.
Vertebral Bodies: Normal in height.
Marrow Signal: Expected.
Intervertebral Discs: Multilevel disc dessication without loss of disc space
height
Paraspinal Soft Tissues: Indeterminate left adrenal nodule measuring
approximately 1.3 cm, unchanged from examination dated 01/10/2023.
Individual Levels: Mild multilevel degenerative changes of the cervical spine
without spinal canal stenosis or foraminal narrowing.
IMPRESSION: 1.  Unchanged brain T2 hyperintense lesions which could be in the context of
reported demyelinating disease.
2.  No abnormal lesion involving the cervical spinal cord.
3.  Apparent T2 hyperintense foci involving the thoracic spinal cord are without
correlate on sagittal images and presumed artifactual, attention on follow-up.
4.  No abnormal enhancement to suggest active disease.
AMFrom Workstation ID: [email or web address]
Is the patient pregnant?
Unknown
